# Patient Record
Sex: Male | Born: 1975 | Race: Black or African American | Hispanic: No | Marital: Single | Smoking: Current every day smoker
Health system: Southern US, Community
[De-identification: ages and names within clinical notes are randomized; demographics above are authoritative.]

## PROBLEM LIST (undated history)

## (undated) DIAGNOSIS — K279 Peptic ulcer, site unspecified, unspecified as acute or chronic, without hemorrhage or perforation: Secondary | ICD-10-CM

## (undated) DIAGNOSIS — F4310 Post-traumatic stress disorder, unspecified: Secondary | ICD-10-CM

## (undated) HISTORY — PX: ABDOMINAL SURGERY: SHX537

---

## 2000-08-17 ENCOUNTER — Emergency Department (HOSPITAL_COMMUNITY): Admission: EM | Admit: 2000-08-17 | Discharge: 2000-08-17 | Payer: Self-pay | Admitting: Emergency Medicine

## 2011-05-11 ENCOUNTER — Emergency Department (HOSPITAL_COMMUNITY)
Admission: EM | Admit: 2011-05-11 | Discharge: 2011-05-11 | Disposition: A | Discharge status: Home / Self Care | Payer: Self-pay | Attending: Emergency Medicine | Admitting: Emergency Medicine

## 2011-05-11 ENCOUNTER — Emergency Department (HOSPITAL_COMMUNITY): Payer: Self-pay

## 2011-05-11 ENCOUNTER — Encounter: Payer: Self-pay | Admitting: *Deleted

## 2011-05-11 ENCOUNTER — Other Ambulatory Visit: Payer: Self-pay

## 2011-05-11 DIAGNOSIS — R071 Chest pain on breathing: Secondary | ICD-10-CM | POA: Insufficient documentation

## 2011-05-11 DIAGNOSIS — R0789 Other chest pain: Secondary | ICD-10-CM

## 2011-05-11 LAB — DIFFERENTIAL
Basophils Absolute: 0 10*3/uL (ref 0.0–0.1)
Basophils Relative: 0 % (ref 0–1)
Eosinophils Absolute: 0.2 10*3/uL (ref 0.0–0.7)
Eosinophils Relative: 1 % (ref 0–5)
Lymphocytes Relative: 38 % (ref 12–46)
Lymphs Abs: 4.3 10*3/uL — ABNORMAL HIGH (ref 0.7–4.0)
Monocytes Absolute: 0.8 10*3/uL (ref 0.1–1.0)
Monocytes Relative: 7 % (ref 3–12)
Neutro Abs: 5.9 10*3/uL (ref 1.7–7.7)
Neutrophils Relative %: 53 % (ref 43–77)

## 2011-05-11 LAB — POCT I-STAT, CHEM 8
BUN: 13 mg/dL (ref 6–23)
Calcium, Ion: 1.16 mmol/L (ref 1.12–1.32)
Chloride: 104 mEq/L (ref 96–112)
Creatinine, Ser: 0.9 mg/dL (ref 0.50–1.35)
Glucose, Bld: 100 mg/dL — ABNORMAL HIGH (ref 70–99)
HCT: 41 % (ref 39.0–52.0)
Hemoglobin: 13.9 g/dL (ref 13.0–17.0)
Potassium: 3.8 mEq/L (ref 3.5–5.1)
Sodium: 141 mEq/L (ref 135–145)
TCO2: 25 mmol/L (ref 0–100)

## 2011-05-11 LAB — CBC
HCT: 38.8 % — ABNORMAL LOW (ref 39.0–52.0)
Hemoglobin: 13.4 g/dL (ref 13.0–17.0)
MCHC: 34.5 g/dL (ref 30.0–36.0)
MCV: 92.8 fL (ref 78.0–100.0)

## 2011-05-11 LAB — D-DIMER, QUANTITATIVE: D-Dimer, Quant: 0.33 ug/mL-FEU (ref 0.00–0.48)

## 2011-05-11 LAB — POCT I-STAT TROPONIN I
Troponin i, poc: 0.01 ng/mL (ref 0.00–0.08)
Troponin i, poc: 0 ng/mL (ref 0.00–0.08)

## 2011-05-11 MED ORDER — MORPHINE SULFATE 4 MG/ML IJ SOLN
4.0000 mg | Freq: Once | INTRAMUSCULAR | Status: AC
  Administered 2011-05-11: 4 mg via INTRAVENOUS
  Filled 2011-05-11: qty 1

## 2011-05-11 MED ORDER — ONDANSETRON HCL 4 MG/2ML IJ SOLN
4.0000 mg | Freq: Once | INTRAMUSCULAR | Status: AC
  Administered 2011-05-11: 4 mg via INTRAVENOUS
  Filled 2011-05-11: qty 2

## 2011-05-11 MED ORDER — HYDROCODONE-ACETAMINOPHEN 7.5-500 MG/15ML PO SOLN: 7.5000 mL | Freq: Three times a day (TID) | ORAL | Status: AC | PRN

## 2011-05-11 MED ORDER — ASPIRIN 81 MG PO CHEW
324.0000 mg | CHEWABLE_TABLET | Freq: Once | ORAL | Status: DC
  Filled 2011-05-11: qty 4

## 2011-05-11 NOTE — ED Notes (Signed)
Iv had been started prior to my shift, 20g right ac, dc prior to discharge, cath intact.

## 2011-05-11 NOTE — ED Notes (Signed)
Pt c/o pain in center of chest that began this evening.  States pain worsens with inspiration and palpation.  Denies recent injury.  States he feels SOB, O2 sat is 98% on RA.  Pain 6/10.  Pt was sleeping prior to assessment.

## 2011-05-11 NOTE — ED Notes (Signed)
C/o generalized CP, also neck, back and shoulder pain, worse with inspiration and movement, thinks it was r/t positioning earlier, "was resting with hand behind head and arm out to the side".

## 2011-05-11 NOTE — ED Notes (Signed)
Pt sleeping in triage, easily arousable, ambulatory to room, report given to TS, RN. EKG done on arrival, ordered per protocol.

## 2011-05-11 NOTE — ED Notes (Signed)
Pt reports that he is going to walk home

## 2011-05-11 NOTE — ED Provider Notes (Signed)
History     CSN: [id number] Arrival date & time: 05/11/2011  1:00 AM   First MD Initiated Contact with Patient 05/11/11 [phone]      Chief Complaint  Patient presents with  . Chest Pain    (Consider location/radiation/quality/duration/timing/severity/associated sxs/prior treatment) Patient is a 35 y.o. male presenting with chest pain. The history is provided by the patient.  Chest Pain The chest pain began 3 - 5 hours ago. Duration of episode(s) is 1 hour. Chest pain occurs constantly. The chest pain is unchanged. The pain is associated with breathing. The severity of the pain is severe. The quality of the pain is described as sharp. The pain does not radiate. Chest pain is worsened by certain positions and deep breathing. Pertinent negatives for primary symptoms include no fever, no syncope, no shortness of breath, no cough, no wheezing, no palpitations, no abdominal pain, no nausea, no vomiting, no dizziness and no altered mental status.  Pertinent negatives for associated symptoms include no near-syncope and no weakness. He tried nothing for the symptoms. Risk factors include alcohol intake, male gender and smoking/tobacco exposure.  Pertinent negatives for past medical history include no CAD, no MI and no PE.  Pertinent negatives for family medical history include: no CAD in family.    he states hurts substernal area, worse with movement, deep breath and with cough.   History reviewed. No pertinent past medical history.  Past Surgical History  Procedure Date  . Abdominal surgery     Family History  Problem Relation Age of Onset  . Heart failure Father     History  Substance Use Topics  . Smoking status: Current Everyday Smoker -- 1.0 packs/day  . Smokeless tobacco: Not on file  . Alcohol Use: No      Review of Systems  Constitutional: Negative for fever and chills.  HENT: Negative for neck pain and neck stiffness.   Eyes: Negative for pain.  Respiratory: Negative for  cough, shortness of breath and wheezing.   Cardiovascular: Positive for chest pain. Negative for palpitations, syncope and near-syncope.  Gastrointestinal: Negative for nausea, vomiting and abdominal pain.  Genitourinary: Negative for dysuria.  Musculoskeletal: Negative for back pain.  Skin: Negative for rash.  Neurological: Negative for dizziness, weakness and headaches.  Psychiatric/Behavioral: Negative for altered mental status.  All other systems reviewed and are negative.    Allergies  Nsaids  Home Medications  No current outpatient prescriptions on file.  BP 105/65  Pulse 69  Temp(Src) 98.2 F (36.8 C) (Oral)  Resp 15  SpO2 96%  Physical Exam  Constitutional: He is oriented to person, place, and time. He appears well-developed and well-nourished.  HENT:  Head: Normocephalic and atraumatic.  Eyes: Conjunctivae and EOM are normal. Pupils are equal, round, and reactive to light.  Neck: Trachea normal. Neck supple. No thyromegaly present.  Cardiovascular: Normal rate, regular rhythm, S1 normal, S2 normal and normal pulses.     No systolic murmur is present   No diastolic murmur is present  Pulses:      Radial pulses are 2+ on the right side, and 2+ on the left side.  Pulmonary/Chest: Effort normal and breath sounds normal. He has no wheezes. He has no rhonchi. He has no rales. He exhibits tenderness.       Reproducible anterior chest wall tenderness, no crepitus or rash. Guarded respirations secondary to pain.  Abdominal: Soft. Normal appearance and bowel sounds are normal. There is no tenderness. There is no CVA tenderness and  negative Murphy's sign.  Musculoskeletal:       BLE:s Calves nontender, no cords or erythema, negative Homans sign  Neurological: He is alert and oriented to person, place, and time. He has normal strength. No cranial nerve deficit or sensory deficit. GCS eye subscore is 4. GCS verbal subscore is 5. GCS motor subscore is 6.  Skin: Skin is warm and  dry. No rash noted. He is not diaphoretic.  Psychiatric: His speech is normal.       Cooperative and appropriate    ED Course  Procedures (including critical care time)  Results for orders placed during the hospital encounter of 05/11/11  CBC      Component Value Range   WBC 11.2 (*) 4.0 - 10.5 (K/uL)   RBC 4.18 (*) 4.22 - 5.81 (MIL/uL)   Hemoglobin 13.4  13.0 - 17.0 (g/dL)   HCT 16.1 (*) 09.6 - 52.0 (%)   MCV 92.8  78.0 - 100.0 (fL)   MCH 32.1  26.0 - 34.0 (pg)   MCHC 34.5  30.0 - 36.0 (g/dL)   RDW 04.5  40.9 - 81.1 (%)   Platelets 362  150 - 400 (K/uL)  DIFFERENTIAL      Component Value Range   Neutrophils Relative 53  43 - 77 (%)   Neutro Abs 5.9  1.7 - 7.7 (K/uL)   Lymphocytes Relative 38  12 - 46 (%)   Lymphs Abs 4.3 (*) 0.7 - 4.0 (K/uL)   Monocytes Relative 7  3 - 12 (%)   Monocytes Absolute 0.8  0.1 - 1.0 (K/uL)   Eosinophils Relative 1  0 - 5 (%)   Eosinophils Absolute 0.2  0.0 - 0.7 (K/uL)   Basophils Relative 0  0 - 1 (%)   Basophils Absolute 0.0  0.0 - 0.1 (K/uL)  POCT I-STAT, CHEM 8      Component Value Range   Sodium 141  135 - 145 (mEq/L)   Potassium 3.8  3.5 - 5.1 (mEq/L)   Chloride 104  96 - 112 (mEq/L)   BUN 13  6 - 23 (mg/dL)   Creatinine, Ser 9.14  0.50 - 1.35 (mg/dL)   Glucose, Bld 782 (*) 70 - 99 (mg/dL)   Calcium, Ion 9.56  2.13 - 1.32 (mmol/L)   TCO2 25  0 - 100 (mmol/L)   Hemoglobin 13.9  13.0 - 17.0 (g/dL)   HCT 08.6  57.8 - 46.9 (%)  POCT I-STAT TROPONIN I      Component Value Range   Troponin i, poc 0.01  0.00 - 0.08 (ng/mL)   Comment 3           D-DIMER, QUANTITATIVE      Component Value Range   D-Dimer, Quant 0.33  0.00 - 0.48 (ug/mL-FEU)  POCT I-STAT TROPONIN I      Component Value Range   Troponin i, poc 0.00  0.00 - 0.08 (ng/mL)   Comment 3            Dg Chest 2 View  05/11/2011  *RADIOLOGY REPORT*  Clinical Data: Chest pain; cough and pain with respiration. History of smoking.  CHEST - 2 VIEW  Comparison: None.  Findings: The  lungs are well-aerated and clear.  There is no evidence of focal opacification, pleural effusion or pneumothorax.  The heart is normal in size; slight apparent rightward shift of the mediastinum may be developmental in nature.  No acute osseous abnormalities are seen.  IMPRESSION: No acute cardiopulmonary process seen.  Original Report Authenticated By: Tonia Ghent, M.D.         Date: 05/11/2011  Rate: 77  Rhythm: normal sinus rhythm  QRS Axis: normal  Intervals: normal  ST/T Wave abnormalities: nonspecific ST changes  Conduction Disutrbances:none  Narrative Interpretation:   Old EKG Reviewed: none available     MDM   Pleuritic chest pain in a smoker with no history of similar symptoms.  EKG reviewed as above. Chest x-ray labs obtained and reviewed as above. Low pretest probability for PE with a negative d-dimer. On recheck pain resolved after one dose of morphine. Reproducible symptoms suggest chest wall pain. doubt ACS. Serial negative troponins        Sunnie Nielsen, MD 05/11/11 2255

## 2011-09-26 ENCOUNTER — Emergency Department (HOSPITAL_COMMUNITY)
Admission: EM | Admit: 2011-09-26 | Discharge: 2011-09-26 | Disposition: A | Discharge status: Home / Self Care | Payer: Self-pay | Attending: Emergency Medicine | Admitting: Emergency Medicine

## 2011-09-26 ENCOUNTER — Encounter (HOSPITAL_COMMUNITY): Payer: Self-pay | Admitting: Nurse Practitioner

## 2011-09-26 DIAGNOSIS — M25519 Pain in unspecified shoulder: Secondary | ICD-10-CM | POA: Insufficient documentation

## 2011-09-26 DIAGNOSIS — F17200 Nicotine dependence, unspecified, uncomplicated: Secondary | ICD-10-CM | POA: Insufficient documentation

## 2011-09-26 MED ORDER — NAPROXEN 375 MG PO TABS: 375.0000 mg | ORAL_TABLET | Freq: Two times a day (BID) | ORAL | Status: DC

## 2011-09-26 MED ORDER — KETOROLAC TROMETHAMINE 60 MG/2ML IM SOLN
60.0000 mg | Freq: Once | INTRAMUSCULAR | Status: AC
  Administered 2011-09-26: 60 mg via INTRAMUSCULAR
  Filled 2011-09-26: qty 2

## 2011-09-26 NOTE — ED Notes (Signed)
Reports old weight lifting injury to L shoulder. Over past 3 months pain has been increasingly worse. Cms intact

## 2011-09-26 NOTE — Discharge Instructions (Signed)
Be sure to read and understand instructions below prior to leaving the hospital. If your symptoms persist without any improvement in 1 week it is reccommended that you follow up with orthopedics listed above. Common mechanisms of injury include:   Direct hit (trauma) to the shoulder.  Aging, erosion of the tendon with normal use.  Bony bump on shoulder (acromial spur).  Stress from sudden increase in duration, frequency, or intensity of training  RISK INCREASES WITH:  Contact sports (football, wrestling, boxing).  Throwing sports (baseball, tennis, volleyball).  Weightlifting and bodybuilding.  Heavy labor.  Previous injury to the rotator cuff, including impingement.  Poor shoulder strength and flexibility.  Failure to warm up properly before activity.  Inadequate protective equipment.  Old age.  Bony bump on shoulder (acromial spur).   RELATED COMPLICATIONS  Shoulder stiffness, frozen shoulder, or loss of motion.  Rotator cuff tendon tear.  Recurring symptoms, especially if activity is resumed too soon, with overuse, with a direct blow, or when using poor technique.   TREATMENT  Treatment first involves the use of ice and medicine, to reduce pain and inflammation. The use of strengthening and stretching exercises may help reduce pain with activity. These exercises may be performed at home or with a therapist.  If non-surgical treatment is unsuccessful after more than 6 months, surgery may be advised. MEDICATION  If pain medicine is needed, nonsteroidal anti-inflammatory medicines (Motrin and ibuprofen), or other minor pain relievers (acetaminophen) Prescription pain relievers may be given, if your caregiver thinks they are needed. Use only as directed and only as much as you need  ACTIVITY       - It is reccommended to perform range of motion activity as tolerated to prevent shoulder stiffness.  HEAT AND COLD  Cold treatment (icing) should be applied for 10 to 15 minutes every 2 to  3 hours for inflammation and pain, and immediately after activity that aggravates your symptoms. Use ice packs or an ice massage.  Heat treatment may be used before performing stretching and strengthening activities prescribed by your caregiver, physical therapist, or athletic trainer. Use a heat pack or a warm water soak.  SEEK IMMEDIATE MEDICAL CARE IF:  Your arm, hand, or fingers are numb or tingling.  Your arm, hand, or fingers are swollen, painful, or turn white or blue.  You develop chest pain or shortness of breath.

## 2011-09-26 NOTE — ED Notes (Signed)
Pt reporting left shoulder pain x several months which has been getting worse. Pt able to move arm but pain worse with supination or hand. Radial pulse 2+, sensation intact. No swelling or redness noted.

## 2011-09-26 NOTE — ED Provider Notes (Signed)
History     CSN: [id number]  Arrival date & time 09/26/11  0910   First MD Initiated Contact with Patient 09/26/11 [phone]      Chief Complaint  Patient presents with  . Shoulder Pain    (Consider location/radiation/quality/duration/timing/severity/associated sxs/prior treatment) HPI Comments: Pt w a history of rotator cuff injury to left shoulder presents with cc of left shoulder pain. Symptoms began 3 months ago and it has been gradually worsening until this morning he cannot use his shoulder at all. Pt denies numbness and tingling of extremity but reports weakness and inability to abduct his arm. No alleviating factors have been tried. Pt has no other complaints at this time.   Patient is a 36 y.o. male presenting with shoulder pain. The history is provided by the patient.  Shoulder Pain This is a chronic problem. The current episode started today (3 mo ago started flaring up again, this morning pain unbearable, unable to use left shoulder at all ). The problem occurs constantly. The problem has been gradually worsening. Associated symptoms include arthralgias. Pertinent negatives include no abdominal pain, anorexia, change in bowel habit, chest pain, chills, congestion, coughing, diaphoresis, fatigue, fever, headaches, joint swelling, myalgias, nausea, neck pain, numbness, rash, sore throat, swollen glands, urinary symptoms, vertigo, visual change, vomiting or weakness.    History reviewed. No pertinent past medical history.  Past Surgical History  Procedure Date  . Abdominal surgery     Family History  Problem Relation Age of Onset  . Heart failure Father     History  Substance Use Topics  . Smoking status: Current Everyday Smoker -- 1.0 packs/day  . Smokeless tobacco: Not on file  . Alcohol Use: Yes     social      Review of Systems  Constitutional: Negative for fever, chills, diaphoresis and fatigue.  HENT: Negative for congestion, sore throat and neck pain.     Respiratory: Negative for cough.   Cardiovascular: Negative for chest pain.  Gastrointestinal: Negative for nausea, vomiting, abdominal pain, anorexia and change in bowel habit.  Musculoskeletal: Positive for arthralgias. Negative for myalgias and joint swelling.  Skin: Negative for rash.  Neurological: Negative for vertigo, weakness, numbness and headaches.  All other systems reviewed and are negative.    Allergies  Nsaids  Home Medications  No current outpatient prescriptions on file.  BP 118/74  Pulse 96  Temp(Src) 98.3 F (36.8 C) (Oral)  Resp 18  Ht 6' (1.829 m)  Wt 165 lb (74.844 kg)  BMI 22.38 kg/m2  SpO2 98%  Physical Exam  Constitutional: He is oriented to person, place, and time. He appears well-developed and well-nourished. No distress.  HENT:  Head: Normocephalic and atraumatic.  Mouth/Throat: Oropharynx is clear and moist. No oropharyngeal exudate.  Eyes: Conjunctivae and EOM are normal. Pupils are equal, round, and reactive to light. No scleral icterus.  Neck: Normal range of motion and full passive range of motion without pain. Neck supple. No spinous process tenderness and no muscular tenderness present. No tracheal deviation present. No thyromegaly present.  Cardiovascular: Normal rate, regular rhythm, normal heart sounds and intact distal pulses.   Pulmonary/Chest: Effort normal and breath sounds normal. No stridor. No respiratory distress. He has no wheezes.  Abdominal: Soft.  Musculoskeletal: He exhibits no edema and no tenderness.       Left shoulder: He exhibits decreased range of motion, tenderness, pain and decreased strength. He exhibits no bony tenderness, no swelling, no effusion and no crepitus.  Decreased strength and range of motion with abduction and external rotation of left shoulder.  Intact distal pulses & sensation.   Neurological: He is alert and oriented to person, place, and time. Coordination normal.  Skin: Skin is warm and dry. No  rash noted. He is not diaphoretic. No erythema. No pallor.  Psychiatric: He has a normal mood and affect. His behavior is normal.    ED Course  Procedures (including critical care time)  Labs Reviewed - No data to display No results found.   No diagnosis found.  The patient denies any neck pain. There is no tenderness on palpation of the cervical spine and no step-offs. The patient can look to the left and right voluntarily without pain and flex and extend the neck without pain. Cervical collar cleared.   MDM  Shoulder pain likely rotator tendinopathy vs impingement.    X-Ray not needed, atraumatic mechanism .No obvious dislocation. Pain managed in ED. Pt advised to follow up with orthopedics if symptoms persist. Conservative therapy recommended and discussed. Patient will be dc home & is agreeable with above plan.         Jaci Carrel, [place] 09/26/11 1015

## 2011-09-26 NOTE — ED Provider Notes (Signed)
Medical screening examination/treatment/procedure(s) were performed by non-physician practitioner and as supervising physician I was immediately available for consultation/collaboration.   Glynn Octave, MD 09/26/11 1538

## 2011-10-04 ENCOUNTER — Emergency Department (HOSPITAL_COMMUNITY)
Admission: EM | Admit: 2011-10-04 | Discharge: 2011-10-04 | Disposition: A | Discharge status: Home / Self Care | Payer: Self-pay | Attending: Emergency Medicine | Admitting: Emergency Medicine

## 2011-10-04 ENCOUNTER — Encounter (HOSPITAL_COMMUNITY): Payer: Self-pay | Admitting: Emergency Medicine

## 2011-10-04 DIAGNOSIS — T7840XA Allergy, unspecified, initial encounter: Secondary | ICD-10-CM

## 2011-10-04 DIAGNOSIS — R0602 Shortness of breath: Secondary | ICD-10-CM | POA: Insufficient documentation

## 2011-10-04 DIAGNOSIS — T394X5A Adverse effect of antirheumatics, not elsewhere classified, initial encounter: Secondary | ICD-10-CM | POA: Insufficient documentation

## 2011-10-04 DIAGNOSIS — L299 Pruritus, unspecified: Secondary | ICD-10-CM | POA: Insufficient documentation

## 2011-10-04 DIAGNOSIS — L509 Urticaria, unspecified: Secondary | ICD-10-CM | POA: Insufficient documentation

## 2011-10-04 DIAGNOSIS — F17200 Nicotine dependence, unspecified, uncomplicated: Secondary | ICD-10-CM | POA: Insufficient documentation

## 2011-10-04 MED ORDER — BENADRYL 25 MG PO TABS: 25.0000 mg | ORAL_TABLET | Freq: Four times a day (QID) | ORAL | Status: DC | PRN

## 2011-10-04 MED ORDER — FAMOTIDINE IN NACL 20-0.9 MG/50ML-% IV SOLN
20.0000 mg | Freq: Once | INTRAVENOUS | Status: AC
  Administered 2011-10-04: 20 mg via INTRAVENOUS
  Filled 2011-10-04: qty 50

## 2011-10-04 MED ORDER — DIPHENHYDRAMINE HCL 50 MG/ML IJ SOLN
50.0000 mg | Freq: Once | INTRAMUSCULAR | Status: AC
  Administered 2011-10-04: 50 mg via INTRAVENOUS
  Filled 2011-10-04: qty 1

## 2011-10-04 MED ORDER — PEPCID 20 MG PO TABS: 20.0000 mg | ORAL_TABLET | Freq: Two times a day (BID) | ORAL | Status: DC

## 2011-10-04 MED ORDER — METHYLPREDNISOLONE SODIUM SUCC 125 MG IJ SOLR
125.0000 mg | Freq: Once | INTRAMUSCULAR | Status: AC
  Administered 2011-10-04: 125 mg via INTRAVENOUS
  Filled 2011-10-04: qty 2

## 2011-10-04 MED ORDER — PREDNISONE 20 MG PO TABS: 40.0000 mg | ORAL_TABLET | Freq: Every day | ORAL | Status: AC

## 2011-10-04 MED ORDER — SODIUM CHLORIDE 0.9 % IV SOLN
INTRAVENOUS | Status: DC
  Administered 2011-10-04: 18:00:00 via INTRAVENOUS

## 2011-10-04 NOTE — ED Notes (Signed)
aaox4, resp e/u, nad. No facial swelling. Hands still swollen.

## 2011-10-04 NOTE — ED Provider Notes (Signed)
History     CSN: [id number]  Arrival date & time 10/04/11  1726   First MD Initiated Contact with Patient 10/04/11 1740      Chief Complaint  Patient presents with  . Allergic Reaction     HPI Pt was seen at 1750.  Per pt, c/o gradual onset and worsening of persistent allergic reaction that began approx 09/26/11 after he took naprosyn.  States he felt "itchy" several hours after taking the medication, but continued to take medicine.  Pt states he now has been having worsening of his "itching" as well as began to have "hives all over" since this morning.  Has been assoc with mild SOB.  Denies wheezing, no CP/palpitations, no abd pain, no N/V/D, no fevers.   History reviewed. No pertinent past medical history.  Past Surgical History  Procedure Date  . Abdominal surgery     Family History  Problem Relation Age of Onset  . Heart failure Father     History  Substance Use Topics  . Smoking status: Current Everyday Smoker -- 1.0 packs/day  . Smokeless tobacco: Not on file  . Alcohol Use: Yes     social    Review of Systems ROS: Statement: All systems negative except as marked or noted in the HPI; Constitutional: Negative for fever and chills. ; ; Eyes: Negative for eye pain, redness and discharge. ; ; ENMT: Negative for ear pain, hoarseness, nasal congestion, sinus pressure and sore throat. ; ; Cardiovascular: Negative for chest pain, palpitations, diaphoresis, dyspnea and peripheral edema. ; ; Respiratory: Negative for cough, wheezing and stridor. ; ; Gastrointestinal: Negative for nausea, vomiting, diarrhea, abdominal pain, blood in stool, hematemesis, jaundice and rectal bleeding. . ; ; Genitourinary: Negative for dysuria, flank pain and hematuria. ; ; Musculoskeletal: Negative for back pain and neck pain. Negative for swelling and trauma.; ; Skin: +diffuse hives. Negative for abrasions, blisters, bruising and skin lesion.; ; Neuro: Negative for headache, lightheadedness and neck  stiffness. Negative for weakness, altered level of consciousness , altered mental status, extremity weakness, paresthesias, involuntary movement, seizure and syncope.     Allergies  Nsaids  Home Medications   Current Outpatient Rx  Name Route Sig Dispense Refill  . NAPROXEN 375 MG PO TABS Oral Take 1 tablet (375 mg total) by mouth 2 (two) times daily. 20 tablet 0    BP 121/64  Pulse 80  Temp(Src) 98.7 F (37.1 C) (Oral)  Resp 18  SpO2 98%  Physical Exam 1755: Physical examination:  Nursing notes reviewed; Vital signs and O2 SAT reviewed;  Constitutional: Well developed, Well nourished, Well hydrated, In no acute distress; Head:  Normocephalic, atraumatic; Eyes: EOMI, PERRL, No scleral icterus; ENMT: Mouth and pharynx normal, no intra-oral edema, no hoarse voice, no drooling, no stridor. Mucous membranes moist; Neck: Supple, Full range of motion, No lymphadenopathy; Cardiovascular: Regular rate and rhythm, No murmur or gallop; Respiratory: Breath sounds clear & equal bilaterally, No wheezes, speaking full sentences with ease, Normal respiratory effort/excursion; Chest: Nontender, Movement normal; Abdomen: Soft, Nontender, Nondistended, Normal bowel sounds; Extremities: Pulses normal, No tenderness, No edema, No calf edema or asymmetry.; Neuro: AA&Ox3, Major CN grossly intact.  No gross focal motor or sensory deficits in extremities.; Skin: Color normal, Warm, Dry, +diffuse hives.    ED Course  Procedures  MDM  MDM Reviewed: nursing note and vitals     1825:  Will dose meds for allergic reaction and move to CDU to hold for the next 4 hours to  monitor for improvement.  Report to NP Crawford.        Laray Anger, DO 10/07/11 0201

## 2011-10-04 NOTE — ED Notes (Signed)
Patient states that he wants some meds for the pain in his left shoulder which was the main reason he came to be seen on 4/11.  Instructed that the prednisone should help with that pain.  Patient states that he does not like to take meds

## 2011-10-04 NOTE — ED Notes (Signed)
The pt has hives all over hisbody.  He has had since  07071m today getting worse  This afternoon.  He say it is startting to affect his breathing.  Facial swelling.  He has not taken any benadryl iv nasal 02.  monitor

## 2011-10-04 NOTE — ED Provider Notes (Signed)
1900 report received from Dr. Clarene Duke. Patient will be admitted to CDU for observation after allergic reaction from naproxen possibly.   2200  Discharged home with rx for prednisone, benadryl and pepcid.  Return if worse.  Remi Haggard, NP 10/05/11 [phone]

## 2011-10-04 NOTE — Discharge Instructions (Signed)
Juan Sawyer you received benadryl, steroids and pepcid in the ER tonight.  We will continue this combination using prednisone 40 mg daily, Benadryl 25 mg every 6 hours x24 hours, and Pepcid 20 mg once a day x5 days. Followup with PCP of your choice for get one from the list below. Return to the ER for difficulty breathing uncontrolled rash and itching.  Allergic Reaction Allergic reactions can be caused by anything your body is sensitive to. Your body may be sensitive to food, medicines, molds, pollens, cockroaches, dust mites, pets, insect stings, and other things around you. An allergic reaction may cause puffiness (swelling), itching, sneezing, coughing, or problems breathing.  Allergies cannot be cured, but they can be controlled with medicine. Some allergies happen only at certain times of the year. Try to stay away from what causes your reaction if possible. Sometimes, it is hard to tell what causes your reaction. HOME CARE If you have a rash or red patches (hives) on your skin:  Take medicines as told by your doctor.   Do not drive or drink alcohol after taking medicines. They can make you sleepy.   Put cold cloths on your skin. Take baths in cool water. This will help your itching. Do not take hot baths or showers. Heat will make the itching worse.   If your allergies get worse, your doctor might give you other medicines. Talk to your doctor if problems continue.  GET HELP RIGHT AWAY IF:   You have trouble breathing.   You have a tight feeling in your chest or throat.   Your mouth gets puffy (swollen).   You have red, itchy patches on your skin (hives) that get worse.   You have itching all over your body.  MAKE SURE YOU:   Understand these instructions.   Will watch your condition.   Will get help right away if you are not doing well or get worse.  Document Released: 05/22/2009 Document Revised: 05/23/2011 Document Reviewed: 05/22/2009 [facility] Patient Information 2012  [place], [place].

## 2011-10-04 NOTE — ED Notes (Signed)
The pt says that he feels better

## 2011-10-04 NOTE — ED Notes (Signed)
C/o itching all over and feeling like throat is closing up.  States he woke up with symptoms this morning.  Taking Naproxen since 4/11.

## 2011-10-04 NOTE — ED Notes (Signed)
Patient unhappy about waiting for a bus pass and left without receiving one.  Patient angry stating it was this hospital that caused him to come back.

## 2011-10-04 NOTE — ED Notes (Signed)
Both hands remain red and swollen but his hives are receding at present.  The pts face is less swollen and he is currently sleeping. 02 sats good .  Nasal 02 at 2

## 2011-10-04 NOTE — ED Notes (Signed)
The pt is sleeping still.  Facial swelling totally gone..  Both hands remain swollen

## 2011-10-04 NOTE — ED Notes (Signed)
The pt is sleeping still breathing with no difficulty

## 2011-10-05 MED ORDER — HEPARIN SODIUM (PORCINE) 5000 UNIT/ML IJ SOLN
INTRAMUSCULAR | Status: AC
  Filled 2011-10-05: qty 1

## 2011-10-07 NOTE — ED Provider Notes (Signed)
Medical screening examination/treatment/procedure(s) were conducted as a shared visit with non-physician practitioner(s) and myself.  I personally evaluated the patient during the encounter    Laray Anger, DO 10/07/11 0202

## 2011-12-09 ENCOUNTER — Encounter (HOSPITAL_COMMUNITY): Payer: Self-pay | Admitting: Emergency Medicine

## 2011-12-09 ENCOUNTER — Emergency Department (HOSPITAL_COMMUNITY)
Admission: EM | Admit: 2011-12-09 | Discharge: 2011-12-09 | Disposition: A | Discharge status: Home / Self Care | Payer: Self-pay | Attending: Emergency Medicine | Admitting: Emergency Medicine

## 2011-12-09 DIAGNOSIS — Z8249 Family history of ischemic heart disease and other diseases of the circulatory system: Secondary | ICD-10-CM | POA: Insufficient documentation

## 2011-12-09 DIAGNOSIS — M719 Bursopathy, unspecified: Secondary | ICD-10-CM | POA: Insufficient documentation

## 2011-12-09 DIAGNOSIS — M67919 Unspecified disorder of synovium and tendon, unspecified shoulder: Secondary | ICD-10-CM | POA: Insufficient documentation

## 2011-12-09 DIAGNOSIS — Z888 Allergy status to other drugs, medicaments and biological substances status: Secondary | ICD-10-CM | POA: Insufficient documentation

## 2011-12-09 DIAGNOSIS — F17200 Nicotine dependence, unspecified, uncomplicated: Secondary | ICD-10-CM | POA: Insufficient documentation

## 2011-12-09 DIAGNOSIS — M75100 Unspecified rotator cuff tear or rupture of unspecified shoulder, not specified as traumatic: Secondary | ICD-10-CM

## 2011-12-09 DIAGNOSIS — M25519 Pain in unspecified shoulder: Secondary | ICD-10-CM | POA: Insufficient documentation

## 2011-12-09 MED ORDER — HYDROCODONE-ACETAMINOPHEN 5-325 MG PO TABS
2.0000 | ORAL_TABLET | Freq: Once | ORAL | Status: AC
  Administered 2011-12-09: 2 via ORAL
  Filled 2011-12-09: qty 2

## 2011-12-09 MED ORDER — HYDROCODONE-ACETAMINOPHEN 5-325 MG PO TABS: 1.0000 | ORAL_TABLET | ORAL | Status: AC | PRN

## 2011-12-09 NOTE — Discharge Instructions (Signed)
You were seen and evaluated for your shoulder pains. Please use rest, ice, compression and elevation to help reduce pain and swelling. Please followup with orthopedic specialist for continued evaluation and treatment of your symptoms.   Shoulder Pain The shoulder is a ball and socket joint. The muscles and tendons (rotator cuff) are what keep the shoulder in its joint and stable. This collection of muscles and tendons holds in the head (ball) of the humerus (upper arm bone) in the fossa (cup) of the scapula (shoulder blade). Today no reason was found for your shoulder pain. Often pain in the shoulder may be treated conservatively with temporary immobilization. For example, holding the shoulder in one place using a sling for rest. Physical therapy may be needed if problems continue. HOME CARE INSTRUCTIONS   Apply ice to the sore area for 15 to 20 minutes, 3 to 4 times per day for the first 2 days. Put the ice in a plastic bag. Place a towel between the bag of ice and your skin.   If you have or were given a shoulder sling and straps, do not remove for as long as directed by your caregiver or until you see a caregiver for a follow-up examination. If you need to remove it to shower or bathe, move your arm as little as possible.   Sleep on several pillows at night to lessen swelling and pain.   Only take over-the-counter or prescription medicines for pain, discomfort, or fever as directed by your caregiver.   Keep any follow-up appointments in order to avoid any type of permanent shoulder disability or chronic pain problems.  SEEK MEDICAL CARE IF:   Pain in your shoulder increases or new pain develops in your arm, hand, or fingers.   Your hand or fingers are colder than your other hand.   You do not obtain pain relief with the medications or your pain becomes worse.  SEEK IMMEDIATE MEDICAL CARE IF:   Your arm, hand, or fingers are numb or tingling.   Your arm, hand, or fingers are swollen,  painful, or turn white or blue.   You develop chest pain or shortness of breath.  MAKE SURE YOU:   Understand these instructions.   Will watch your condition.   Will get help right away if you are not doing well or get worse.  Document Released: 03/13/2005 Document Revised: 05/23/2011 Document Reviewed: 05/18/2011 [facility] Patient Information 2012 [place], [place].     Shoulder Range of Motion Exercises The shoulder is the most flexible joint in the human body. Because of this it is also the most unstable joint in the body. All ages can develop shoulder problems. Early treatment of problems is necessary for a good outcome. People react to shoulder pain by decreasing the movement of the joint. After a brief period of time, the shoulder can become "frozen". This is an almost complete loss of the ability to move the damaged shoulder. Following injuries your caregivers can give you instructions on exercises to keep your range of motion (ability to move your shoulder freely), or regain it if it has been lost.  EXERCISES EXERCISES TO MAINTAIN THE MOBILITY OF YOUR SHOULDER: Codman's Exercise or Pendulum Exercise  This exercise may be performed in a prone (face-down) lying position or standing while leaning on a chair with the opposite arm. Its purpose is to relax the muscles in your shoulder and slowly but surely increase the range of motion and to relieve pain.   Lie on your  stomach close to the side edge of the bed. Let your weak arm hang over the edge of the bed. Relax your shoulder, arm and hand. Let your shoulder blade relax and drop down.   Slowly and gently swing your arm forward and back. Do not use your neck muscles; relax them. It might be easier to have someone else gently start swinging your arm.   As pain decreases, increase your swing. To start, arm swing should begin at 15 degree angles. In time and as pain lessens, move to 30-45 degree angles. Start with swinging for about 15  seconds, and work towards swinging for 3 to 5 minutes.   This exercise may also be performed in a standing/bent over position.   Stand and hold onto a sturdy chair with your good arm. Bend forward at the waist and bend your knees slightly to help protect your back. Relax your weak arm, let it hang limp. Relax your shoulder blade and let it drop.   Keep your shoulder relaxed and use body motion to swing your arm in small circles.   Stand up tall and relax.   Repeat motion and change direction of circles.   Start with swinging for about 30 seconds, and work towards swinging for 3 to 5 minutes.  STRETCHING EXERCISES:  Lift your arm out in front of you with the elbow bent at 90 degrees. Using your other arm gently pull the elbow forward and across your body.   Bend one arm behind you with the palm facing outward. Using the other arm, hold a towel or rope and reach this arm up above your head, then bend it at the elbow to move your wrist to behind your neck. Grab the free end of the towel with the hand behind your back. Gently pull the towel up with the hand behind your neck, gradually increasing the pull on the hand behind the small of your back. Then, gradually pull down with the hand behind the small of your back. This will pull the hand and arm behind your neck further. Both shoulders will have an increased range of motion with repetition of this exercise.  STRENGTHENING EXERCISES:  Standing with your arm at your side and straight out from your shoulder with the elbow bent at 90 degrees, hold onto a small weight and slowly raise your hand so it points straight up in the air. Repeat this five times to begin with, and gradually increase to ten times. Do this four times per day. As you grow stronger you can gradually increase the weight.   Repeat the above exercise, only this time using an elastic band. Start with your hand up in the air and pull down until your hand is by your side. As you grow  stronger, gradually increase the amount you pull by increasing the number or size of the elastic bands. Use the same amount of repetitions.   Standing with your hand at your side and holding onto a weight, gradually lift the hand in front of you until it is over your head. Do the same also with the hand remaining at your side and lift the hand away from your body until it is again over your head. Repeat this five times to begin with, and gradually increase to ten times. Do this four times per day. As you grow stronger you can gradually increase the weight.  Document Released: 03/02/2003 Document Revised: 05/23/2011 Document Reviewed: 06/03/2005 [facility] Patient Information 2012 [place], [place].
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
## 2011-12-09 NOTE — ED Notes (Signed)
Pt to ED c/o hurting L shoulder d/t chronic rotator cuff pain.  Increased pain when raising arm.

## 2011-12-09 NOTE — ED Provider Notes (Signed)
History     CSN: [id number]  Arrival date & time 12/09/11  4098   First MD Initiated Contact with Patient 12/09/11 2157      Chief Complaint  Patient presents with  . Shoulder Pain    HPI  History provided by the patient. Patient is a 36 year old male with no significant past medical history who presents with complaints of left shoulder pain. Patient reports having old injury to the shoulder in the past but states he normally has no pain or palpitations with the shoulder. About 3 or 4 days ago patient states he began to have some soreness and aching after lifting a heavy bag of groceries. Since that time he has had worsening pain with decreased range of motion left shoulder. He is trying to use over-the-counter pain medications without significant relief. He denies any other aggravating or alleviating factors. He denies any associated numbness or weakness in the hand. He denies any neck pain or prior neck injury. Symptoms are described as severe.    History reviewed. No pertinent past medical history.  Past Surgical History  Procedure Date  . Abdominal surgery     Family History  Problem Relation Age of Onset  . Heart failure Father     History  Substance Use Topics  . Smoking status: Current Everyday Smoker -- 1.0 packs/day  . Smokeless tobacco: Not on file  . Alcohol Use: Yes     social      Review of Systems  HENT: Negative for neck pain.   Musculoskeletal: Negative for joint swelling.  Skin: Negative for rash.  Neurological: Negative for weakness and numbness.    Allergies  Naprosyn and Nsaids  Home Medications  No current outpatient prescriptions on file.  BP 115/63  Pulse 81  Temp 98.8 F (37.1 C) (Oral)  Resp 18  SpO2 96%  Physical Exam  Nursing note and vitals reviewed. Constitutional: He appears well-developed and well-nourished. No distress.  HENT:  Head: Normocephalic.  Neck: Normal range of motion. Neck supple.       No cervical midline  tenderness.  Cardiovascular: Normal rate and regular rhythm.   Pulmonary/Chest: Effort normal and breath sounds normal.  Musculoskeletal:       Pain with range of motion of left shoulder is slightly reduced range of motion. Pain is greatest with abduction. There is tenderness over the anterior portion of the shoulder and deltoid. Normal a.c. joint without deformity. Normal clavicle and scapula. Normal distal radial pulse, sensation in hands and fingers with normal cap refill less than 2 seconds.  Neurological: He is alert.  Skin: Skin is warm.  Psychiatric: He has a normal mood and affect. His behavior is normal.    ED Course  Procedures     1. Shoulder pain   2. Rotator cuff syndrome       MDM  10:50PM patient seen and evaluated. Patient no acute distress.        Angus Seller, [place] 12/11/11 [phone]

## 2011-12-12 NOTE — ED Provider Notes (Signed)
Medical screening examination/treatment/procedure(s) were performed by non-physician practitioner and as supervising physician I was immediately available for consultation/collaboration.   Nat Christen, MD 12/12/11 [phone]

## 2012-02-09 ENCOUNTER — Emergency Department (HOSPITAL_COMMUNITY)
Admission: EM | Admit: 2012-02-09 | Discharge: 2012-02-09 | Disposition: A | Discharge status: Home / Self Care | Payer: No Typology Code available for payment source | Attending: Emergency Medicine | Admitting: Emergency Medicine

## 2012-02-09 ENCOUNTER — Encounter (HOSPITAL_COMMUNITY): Payer: Self-pay | Admitting: Emergency Medicine

## 2012-02-09 DIAGNOSIS — M545 Low back pain: Secondary | ICD-10-CM

## 2012-02-09 DIAGNOSIS — F17200 Nicotine dependence, unspecified, uncomplicated: Secondary | ICD-10-CM | POA: Insufficient documentation

## 2012-02-09 DIAGNOSIS — Y9389 Activity, other specified: Secondary | ICD-10-CM | POA: Insufficient documentation

## 2012-02-09 DIAGNOSIS — S46909A Unspecified injury of unspecified muscle, fascia and tendon at shoulder and upper arm level, unspecified arm, initial encounter: Secondary | ICD-10-CM | POA: Insufficient documentation

## 2012-02-09 DIAGNOSIS — M25519 Pain in unspecified shoulder: Secondary | ICD-10-CM

## 2012-02-09 DIAGNOSIS — S4980XA Other specified injuries of shoulder and upper arm, unspecified arm, initial encounter: Secondary | ICD-10-CM | POA: Insufficient documentation

## 2012-02-09 DIAGNOSIS — Y998 Other external cause status: Secondary | ICD-10-CM | POA: Insufficient documentation

## 2012-02-09 MED ORDER — HYDROCODONE-ACETAMINOPHEN 5-500 MG PO TABS: 1.0000 | ORAL_TABLET | Freq: Four times a day (QID) | ORAL | Status: AC | PRN
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
## 2012-04-12 ENCOUNTER — Encounter (HOSPITAL_COMMUNITY): Payer: Self-pay

## 2012-04-12 ENCOUNTER — Emergency Department (HOSPITAL_COMMUNITY)
Admission: EM | Admit: 2012-04-12 | Discharge: 2012-04-12 | Disposition: A | Discharge status: Home / Self Care | Payer: Self-pay | Attending: Emergency Medicine | Admitting: Emergency Medicine

## 2012-04-12 DIAGNOSIS — Z8711 Personal history of peptic ulcer disease: Secondary | ICD-10-CM | POA: Insufficient documentation

## 2012-04-12 DIAGNOSIS — K625 Hemorrhage of anus and rectum: Secondary | ICD-10-CM | POA: Insufficient documentation

## 2012-04-12 DIAGNOSIS — F1210 Cannabis abuse, uncomplicated: Secondary | ICD-10-CM | POA: Insufficient documentation

## 2012-04-12 DIAGNOSIS — F17200 Nicotine dependence, unspecified, uncomplicated: Secondary | ICD-10-CM | POA: Insufficient documentation

## 2012-04-12 HISTORY — DX: Peptic ulcer, site unspecified, unspecified as acute or chronic, without hemorrhage or perforation: K27.9

## 2012-04-12 LAB — COMPREHENSIVE METABOLIC PANEL
ALT: 14 U/L (ref 0–53)
BUN: 11 mg/dL (ref 6–23)
CO2: 24 mEq/L (ref 19–32)
Calcium: 9 mg/dL (ref 8.4–10.5)
Creatinine, Ser: 1.01 mg/dL (ref 0.50–1.35)
GFR calc Af Amer: >90 mL/min (ref >90)
GFR calc non Af Amer: >90 mL/min (ref >90)
Glucose, Bld: 93 mg/dL (ref 70–99)

## 2012-04-12 LAB — CBC
HCT: 42.3 % (ref 39.0–52.0)
Hemoglobin: 14.9 g/dL (ref 13.0–17.0)
RBC: 4.62 MIL/uL (ref 4.22–5.81)

## 2012-04-12 LAB — OCCULT BLOOD, POC DEVICE: Fecal Occult Bld: NEGATIVE

## 2012-04-12 MED ORDER — FAMOTIDINE 20 MG PO TABS: 20.0000 mg | ORAL_TABLET | Freq: Two times a day (BID) | ORAL | Status: DC

## 2012-04-12 MED ORDER — SODIUM CHLORIDE 0.9 % IV BOLUS (SEPSIS)
1000.0000 mL | Freq: Once | INTRAVENOUS | Status: AC
  Administered 2012-04-12: 1000 mL via INTRAVENOUS

## 2012-04-12 NOTE — ED Notes (Signed)
Lab at bedside

## 2012-04-12 NOTE — ED Provider Notes (Signed)
History     CSN: [id number]  Arrival date & time 04/12/12  9147   First MD Initiated Contact with Patient 04/12/12 [phone]      Chief Complaint  Patient presents with  . Rectal Bleeding    (Consider location/radiation/quality/duration/timing/severity/associated sxs/prior treatment) Patient is a 36 y.o. male presenting with abdominal pain. The history is provided by the patient. No language interpreter was used.  Abdominal Pain The primary symptoms of the illness include abdominal pain, shortness of breath and nausea. The primary symptoms of the illness do not include fever or vomiting.  The illness is associated with retching. Symptoms associated with the illness do not include chills, constipation, urgency, hematuria, frequency or back pain. Significant associated medical issues include PUD. Significant associated medical issues do not include GERD, diabetes, liver disease, diverticulitis, HIV or cardiac disease.   36 year old male coming in with complaint of blood in his stool this morning. States that he had a large bowel movement and the water turned peak. States that after that he did vomit some tea in substance after dry heaving as well. Complaining of mild epigastric pain. Patient is on no PPI. Denies hemorrhoids, constipation, dizziness, palpitations. Patient also having some epigastric pain. States that he has a past medical history of perforated ulcer 2 years ago he had abdominal surgery in [place]. States that he rarely drinks alcohol but he does use marijuana. Last recorded hemoglobin was 11.2. No other past medical history.    Past Medical History  Diagnosis Date  . Peptic ulcer disease     Past Surgical History  Procedure Date  . Abdominal surgery     Family History  Problem Relation Age of Onset  . Heart failure Father     History  Substance Use Topics  . Smoking status: Current Every Day Smoker -- 2.0 packs/day  . Smokeless tobacco: Current User  . Alcohol  Use: Yes     social      Review of Systems  Constitutional: Negative.  Negative for fever and chills.  HENT: Negative.   Eyes: Negative.   Respiratory: Positive for shortness of breath.   Cardiovascular: Negative.   Gastrointestinal: Positive for nausea, abdominal pain and blood in stool. Negative for vomiting, constipation and rectal pain.  Genitourinary: Negative for urgency, frequency and hematuria.  Musculoskeletal: Negative for back pain.  Neurological: Negative.  Negative for dizziness, syncope, weakness and light-headedness.  Psychiatric/Behavioral: Negative.   All other systems reviewed and are negative.    Allergies  Naprosyn and Nsaids  Home Medications   Current Outpatient Rx  Name Route Sig Dispense Refill  . DIPHENHYDRAMINE HCL 25 MG PO CAPS Oral Take 25 mg by mouth at bedtime as needed. For sleep      BP 133/80  Pulse 83  Temp 98.7 F (37.1 C) (Oral)  Resp 20  SpO2 98%  Physical Exam  Nursing note and vitals reviewed. Constitutional: He is oriented to person, place, and time. He appears well-developed and well-nourished.  HENT:  Head: Normocephalic.  Eyes: Conjunctivae normal and EOM are normal. Pupils are equal, round, and reactive to light.  Neck: Normal range of motion. Neck supple.  Cardiovascular: Normal rate.   Pulmonary/Chest: Effort normal.  Abdominal: Soft. He exhibits no distension and no mass. There is tenderness. There is no rebound and no guarding.  Musculoskeletal: Normal range of motion.  Neurological: He is alert and oriented to person, place, and time.  Skin: Skin is warm and dry.  Psychiatric: He has a normal  mood and affect.    ED Course  Procedures (including critical care time)   Labs Reviewed  CBC  COMPREHENSIVE METABOLIC PANEL   No results found.   No diagnosis found.    MDM  Blood tinged bm this am with pmh of pud.  hgb 14. Guiac -. VSS no dizziness.  All labs unremarkable.  Will get pcp from list to follow  up with.  Start pepcid.  Return to ER for dizziness, Homero Fellers blood or any concerns.         Remi Haggard, NP 04/12/12 1932

## 2012-04-12 NOTE — ED Notes (Signed)
Pt noticed blood in stool when flushing the commode this am.

## 2012-04-14 NOTE — ED Provider Notes (Signed)
Medical screening examination/treatment/procedure(s) were performed by non-physician practitioner and as supervising physician I was immediately available for consultation/collaboration.  Djon Tith, MD 04/14/12 2159 

## 2012-06-29 ENCOUNTER — Emergency Department (HOSPITAL_COMMUNITY)
Admission: EM | Admit: 2012-06-29 | Discharge: 2012-06-29 | Disposition: A | Discharge status: Home / Self Care | Payer: Self-pay | Attending: Emergency Medicine | Admitting: Emergency Medicine

## 2012-06-29 ENCOUNTER — Encounter (HOSPITAL_COMMUNITY): Payer: Self-pay | Admitting: Emergency Medicine

## 2012-06-29 DIAGNOSIS — T394X5A Adverse effect of antirheumatics, not elsewhere classified, initial encounter: Secondary | ICD-10-CM | POA: Insufficient documentation

## 2012-06-29 DIAGNOSIS — R221 Localized swelling, mass and lump, neck: Secondary | ICD-10-CM | POA: Insufficient documentation

## 2012-06-29 DIAGNOSIS — Z8711 Personal history of peptic ulcer disease: Secondary | ICD-10-CM | POA: Insufficient documentation

## 2012-06-29 DIAGNOSIS — R0609 Other forms of dyspnea: Secondary | ICD-10-CM | POA: Insufficient documentation

## 2012-06-29 DIAGNOSIS — T7840XA Allergy, unspecified, initial encounter: Secondary | ICD-10-CM

## 2012-06-29 DIAGNOSIS — R220 Localized swelling, mass and lump, head: Secondary | ICD-10-CM | POA: Insufficient documentation

## 2012-06-29 DIAGNOSIS — L2989 Other pruritus: Secondary | ICD-10-CM | POA: Insufficient documentation

## 2012-06-29 DIAGNOSIS — K047 Periapical abscess without sinus: Secondary | ICD-10-CM

## 2012-06-29 DIAGNOSIS — K089 Disorder of teeth and supporting structures, unspecified: Secondary | ICD-10-CM | POA: Insufficient documentation

## 2012-06-29 DIAGNOSIS — Z79899 Other long term (current) drug therapy: Secondary | ICD-10-CM | POA: Insufficient documentation

## 2012-06-29 DIAGNOSIS — K044 Acute apical periodontitis of pulpal origin: Secondary | ICD-10-CM | POA: Insufficient documentation

## 2012-06-29 DIAGNOSIS — L298 Other pruritus: Secondary | ICD-10-CM | POA: Insufficient documentation

## 2012-06-29 DIAGNOSIS — K0889 Other specified disorders of teeth and supporting structures: Secondary | ICD-10-CM

## 2012-06-29 DIAGNOSIS — F17200 Nicotine dependence, unspecified, uncomplicated: Secondary | ICD-10-CM | POA: Insufficient documentation

## 2012-06-29 DIAGNOSIS — R0989 Other specified symptoms and signs involving the circulatory and respiratory systems: Secondary | ICD-10-CM | POA: Insufficient documentation

## 2012-06-29 DIAGNOSIS — R61 Generalized hyperhidrosis: Secondary | ICD-10-CM | POA: Insufficient documentation

## 2012-06-29 MED ORDER — OXYCODONE-ACETAMINOPHEN 5-325 MG PO TABS
2.0000 | ORAL_TABLET | Freq: Once | ORAL | Status: AC
  Administered 2012-06-29: 2 via ORAL
  Filled 2012-06-29: qty 2

## 2012-06-29 MED ORDER — ACETAMINOPHEN-CODEINE #3 300-30 MG PO TABS: 1.0000 | ORAL_TABLET | Freq: Four times a day (QID) | ORAL | Status: DC | PRN

## 2012-06-29 MED ORDER — DIPHENHYDRAMINE HCL 25 MG PO TABS: 25.0000 mg | ORAL_TABLET | Freq: Four times a day (QID) | ORAL | Status: DC

## 2012-06-29 MED ORDER — AMOXICILLIN 500 MG PO CAPS: 500.0000 mg | ORAL_CAPSULE | Freq: Three times a day (TID) | ORAL | Status: DC

## 2012-06-29 NOTE — ED Notes (Signed)
Pt took naproxen at 0830 for dental pain. Pt reports EMS was called, pt was sweating, vomiting and felt weak. Pt currently resting. Pt oriented, appropriate but sleepy. Friend at bedside

## 2012-06-29 NOTE — ED Notes (Signed)
LKG:MWN0<UV> Expected date:<BR> Expected time:<BR> Means of arrival:<BR> Comments:<BR> Dental pain/allergic to his NSAID he took for pain

## 2012-06-29 NOTE — ED Provider Notes (Signed)
History     CSN: [id number]  Arrival date & time 06/29/12  2130   First MD Initiated Contact with Patient 06/29/12 1029      Chief Complaint  Patient presents with  . Allergic Reaction    took naproxin at 0830. Pt stated sweating, itching  . Dental Problem    denies pain at present    (Consider location/radiation/quality/duration/timing/severity/associated sxs/prior treatment) HPI Comments: 37 year old male presents emergency department with his girlfriend after taking 2 naproxen for dental pain at home and being unaware that this is an NSAID which he is allergic to. In the past he took ibuprofen which causes face as well. Today is his lips again to swell a little bit and it became difficult to breathe. His girlfriend called EMS and was given Benadryl which provided complete relief of this reaction. States the Aleve did help his dental pain, however anytime he bites of something the pain is worse. Admits he needs to see a dentist. Denies fever, chills, nausea or vomiting. Currently denies difficulty breathing or swallowing. No rash.  Patient is a 37 y.o. male presenting with allergic reaction. The history is provided by the patient and a significant other.  Allergic Reaction The primary symptoms do not include shortness of breath, nausea, vomiting or rash.    Past Medical History  Diagnosis Date  . Peptic ulcer disease     Past Surgical History  Procedure Date  . Abdominal surgery     Family History  Problem Relation Age of Onset  . Heart failure Father   . Diabetes Father   . Hypertension Father   . Cancer Father   . Hypertension Mother   . Diabetes Mother   . Cancer Mother     History  Substance Use Topics  . Smoking status: Current Every Day Smoker -- 2.0 packs/day    Types: Cigarettes  . Smokeless tobacco: Current User  . Alcohol Use: Yes     Comment: social      Review of Systems  Constitutional: Negative for fever and chills.  HENT: Positive for facial  swelling and dental problem. Negative for trouble swallowing.   Respiratory: Negative for chest tightness and shortness of breath.   Cardiovascular: Negative for chest pain.  Gastrointestinal: Negative for nausea and vomiting.  Skin: Negative for rash.  All other systems reviewed and are negative.    Allergies  Naprosyn and Nsaids  Home Medications   Current Outpatient Rx  Name  Route  Sig  Dispense  Refill  . NAPROXEN SODIUM 220 MG PO TABS   Oral   Take 220 mg by mouth 2 (two) times daily with a meal. pain         . ACETAMINOPHEN-CODEINE #3 300-30 MG PO TABS   Oral   Take 1-2 tablets by mouth every 6 (six) hours as needed for pain.   10 tablet   0   . DIPHENHYDRAMINE HCL 25 MG PO TABS   Oral   Take 1 tablet (25 mg total) by mouth every 6 (six) hours.   20 tablet   0     BP 119/83  Pulse 69  Temp 97.5 F (36.4 C) (Oral)  Resp 18  Ht 6' (1.829 m)  Wt 165 lb (74.844 kg)  BMI 22.38 kg/m2  SpO2 100%  Physical Exam  Nursing note and vitals reviewed. Constitutional: He is oriented to person, place, and time. He appears well-developed and well-nourished. No distress.  HENT:  Head: Normocephalic and atraumatic.  Mouth/Throat:  Uvula is midline, oropharynx is clear and moist and mucous membranes are normal. Abnormal dentition. Dental caries present. No dental abscesses or uvula swelling. No posterior oropharyngeal edema.    Eyes: Conjunctivae normal and EOM are normal. Pupils are equal, round, and reactive to light.  Neck: Normal range of motion. Neck supple. No tracheal deviation present.  Cardiovascular: Normal rate, regular rhythm, normal heart sounds and intact distal pulses.   Pulmonary/Chest: Effort normal and breath sounds normal. No respiratory distress. He has no wheezes.  Abdominal: Soft. Bowel sounds are normal. There is no tenderness.  Musculoskeletal: Normal range of motion. He exhibits no edema.  Neurological: He is alert and oriented to person, place,  and time.  Skin: Skin is warm and dry. No rash noted.  Psychiatric: He has a normal mood and affect. His behavior is normal.    ED Course  Procedures (including critical care time)  Labs Reviewed - No data to display No results found.   1. Allergic reaction to drug   2. Dental infection   3. Pain, dental       MDM  37 year old male with allergic reaction to naproxen dental pain. Currently he has no facial swelling, difficulty breathing or swallowing. He is in no respiratory distress.  Dental pain associated with dental infection. No evidence of dental abscess. Patient is afebrile, non toxic appearing and swallowing secretions well. I gave patient referral to dentist and stressed the importance of dental follow up for ultimate management of dental pain. I will also give amoxicillin and pain control. Patient voices understanding and is agreeable to plan.         Trevor Mace, PA-C 06/29/12 1114

## 2012-07-02 NOTE — ED Provider Notes (Signed)
Medical screening examination/treatment/procedure(s) were performed by non-physician practitioner and as supervising physician I was immediately available for consultation/collaboration.  Laynee Lockamy T Blakeleigh Domek, MD 07/02/12 0750 

## 2013-07-02 ENCOUNTER — Encounter (HOSPITAL_COMMUNITY): Payer: Self-pay | Admitting: Emergency Medicine

## 2013-07-02 ENCOUNTER — Emergency Department (HOSPITAL_COMMUNITY)
Admission: EM | Admit: 2013-07-02 | Discharge: 2013-07-02 | Disposition: A | Discharge status: Home / Self Care | Payer: Self-pay | Attending: Emergency Medicine | Admitting: Emergency Medicine

## 2013-07-02 DIAGNOSIS — F411 Generalized anxiety disorder: Secondary | ICD-10-CM | POA: Insufficient documentation

## 2013-07-02 DIAGNOSIS — F4310 Post-traumatic stress disorder, unspecified: Secondary | ICD-10-CM | POA: Insufficient documentation

## 2013-07-02 DIAGNOSIS — F17200 Nicotine dependence, unspecified, uncomplicated: Secondary | ICD-10-CM | POA: Insufficient documentation

## 2013-07-02 DIAGNOSIS — F3289 Other specified depressive episodes: Secondary | ICD-10-CM | POA: Insufficient documentation

## 2013-07-02 DIAGNOSIS — R45851 Suicidal ideations: Secondary | ICD-10-CM | POA: Insufficient documentation

## 2013-07-02 DIAGNOSIS — Z8711 Personal history of peptic ulcer disease: Secondary | ICD-10-CM | POA: Insufficient documentation

## 2013-07-02 DIAGNOSIS — F329 Major depressive disorder, single episode, unspecified: Secondary | ICD-10-CM | POA: Insufficient documentation

## 2013-07-02 DIAGNOSIS — F32A Depression, unspecified: Secondary | ICD-10-CM

## 2013-07-02 HISTORY — DX: Post-traumatic stress disorder, unspecified: F43.10

## 2013-07-02 LAB — CBC WITH DIFFERENTIAL/PLATELET
BASOS ABS: 0 10*3/uL (ref 0.0–0.1)
BASOS PCT: 0 % (ref 0–1)
EOS PCT: 0 % (ref 0–5)
Eosinophils Absolute: 0 10*3/uL (ref 0.0–0.7)
HEMATOCRIT: 38.8 % — AB (ref 39.0–52.0)
HEMOGLOBIN: 13.3 g/dL (ref 13.0–17.0)
LYMPHS PCT: 26 % (ref 12–46)
Lymphs Abs: 2.6 10*3/uL (ref 0.7–4.0)
MCH: 31.6 pg (ref 26.0–34.0)
MCHC: 34.3 g/dL (ref 30.0–36.0)
MCV: 92.2 fL (ref 78.0–100.0)
MONO ABS: 0.8 10*3/uL (ref 0.1–1.0)
MONOS PCT: 7 % (ref 3–12)
NEUTROS ABS: 6.9 10*3/uL (ref 1.7–7.7)
Neutrophils Relative %: 66 % (ref 43–77)
Platelets: 374 10*3/uL (ref 150–400)
RBC: 4.21 MIL/uL — ABNORMAL LOW (ref 4.22–5.81)
RDW: 13.7 % (ref 11.5–15.5)
WBC: 10.3 10*3/uL (ref 4.0–10.5)

## 2013-07-02 LAB — BASIC METABOLIC PANEL
BUN: 17 mg/dL (ref 6–23)
CALCIUM: 9 mg/dL (ref 8.4–10.5)
CHLORIDE: 99 meq/L (ref 96–112)
CO2: 24 meq/L (ref 19–32)
CREATININE: 0.88 mg/dL (ref 0.50–1.35)
GFR calc Af Amer: >90 mL/min (ref >90)
GFR calc non Af Amer: >90 mL/min (ref >90)
Glucose, Bld: 95 mg/dL (ref 70–99)
Potassium: 4 mEq/L (ref 3.7–5.3)
Sodium: 138 mEq/L (ref 137–147)

## 2013-07-02 LAB — URINALYSIS, ROUTINE W REFLEX MICROSCOPIC
Glucose, UA: NEGATIVE mg/dL
Hgb urine dipstick: NEGATIVE
Ketones, ur: 15 mg/dL — AB
LEUKOCYTES UA: NEGATIVE
NITRITE: NEGATIVE
PROTEIN: NEGATIVE mg/dL
SPECIFIC GRAVITY, URINE: 1.04 — AB (ref 1.005–1.030)
UROBILINOGEN UA: 1 mg/dL (ref 0.0–1.0)
pH: 5.5 (ref 5.0–8.0)

## 2013-07-02 LAB — RAPID URINE DRUG SCREEN, HOSP PERFORMED
AMPHETAMINES: NOT DETECTED
Barbiturates: NOT DETECTED
Benzodiazepines: NOT DETECTED
Cocaine: NOT DETECTED
OPIATES: NOT DETECTED
TETRAHYDROCANNABINOL: POSITIVE — AB

## 2013-07-02 LAB — ETHANOL

## 2013-07-02 MED ORDER — ACETAMINOPHEN 325 MG PO TABS: 650.0000 mg | ORAL_TABLET | ORAL | Status: DC | PRN

## 2013-07-02 MED ORDER — ZOLPIDEM TARTRATE 5 MG PO TABS: 5.0000 mg | ORAL_TABLET | Freq: Every evening | ORAL | Status: DC | PRN

## 2013-07-02 MED ORDER — IBUPROFEN 200 MG PO TABS: 600.0000 mg | ORAL_TABLET | Freq: Three times a day (TID) | ORAL | Status: DC | PRN

## 2013-07-02 MED ORDER — ONDANSETRON HCL 4 MG PO TABS: 4.0000 mg | ORAL_TABLET | Freq: Three times a day (TID) | ORAL | Status: DC | PRN

## 2013-07-02 NOTE — ED Notes (Signed)
Pt belongings x2 bags placed into locker 32 in TCU

## 2013-07-02 NOTE — ED Notes (Signed)
Pt belongings returned to pt and patient states that all belongings present at time of discharge. Pt is in hall pacing and delay explained to pt. Pt is agreeable to wait for his paperwork however he is very restless.

## 2013-07-02 NOTE — Discharge Instructions (Signed)
Depression, Adult °Depression refers to feeling sad, low, down in the dumps, blue, gloomy, or empty. In general, there are two kinds of depression: °1. Depression that we all experience from time to time because of upsetting life experiences, including the loss of a job or the ending of a relationship (normal sadness or normal grief). This kind of depression is considered normal, is short lived, and resolves within a few days to 2 weeks. (Depression experienced after the loss of a loved one is called bereavement. Bereavement often lasts longer than 2 weeks but normally gets better with time.) °2. Clinical depression, which lasts longer than normal sadness or normal grief or interferes with your ability to function at home, at work, and in school. It also interferes with your personal relationships. It affects almost every aspect of your life. Clinical depression is an illness. °Symptoms of depression also can be caused by conditions other than normal sadness and grief or clinical depression. Examples of these conditions are listed as follows: °· Physical illness Some physical illnesses, including underactive thyroid gland (hypothyroidism), severe anemia, specific types of cancer, diabetes, uncontrolled seizures, heart and lung problems, strokes, and chronic pain are commonly associated with symptoms of depression. °· Side effects of some prescription medicine In some people, certain types of prescription medicine can cause symptoms of depression. °· Substance abuse Abuse of alcohol and illicit drugs can cause symptoms of depression. °SYMPTOMS °Symptoms of normal sadness and normal grief include the following: °· Feeling sad or crying for short periods of time. °· Not caring about anything (apathy). °· Difficulty sleeping or sleeping too much. °· No longer able to enjoy the things you used to enjoy. °· Desire to be by oneself all the time (social isolation). °· Lack of energy or motivation. °· Difficulty  concentrating or remembering. °· Change in appetite or weight. °· Restlessness or agitation. °Symptoms of clinical depression include the same symptoms of normal sadness or normal grief and also the following symptoms: °· Feeling sad or crying all the time. °· Feelings of guilt or worthlessness. °· Feelings of hopelessness or helplessness. °· Thoughts of suicide or the desire to harm yourself (suicidal ideation). °· Loss of touch with reality (psychotic symptoms). Seeing or hearing things that are not real (hallucinations) or having false beliefs about your life or the people around you (delusions and paranoia). °DIAGNOSIS  °The diagnosis of clinical depression usually is based on the severity and duration of the symptoms. Your caregiver also will ask you questions about your medical history and substance use to find out if physical illness, use of prescription medicine, or substance abuse is causing your depression. Your caregiver also may order blood tests. °TREATMENT  °Typically, normal sadness and normal grief do not require treatment. However, sometimes antidepressant medicine is prescribed for bereavement to ease the depressive symptoms until they resolve. °The treatment for clinical depression depends on the severity of your symptoms but typically includes antidepressant medicine, counseling with a mental health professional, or a combination of both. Your caregiver will help to determine what treatment is best for you. °Depression caused by physical illness usually goes away with appropriate medical treatment of the illness. If prescription medicine is causing depression, talk with your caregiver about stopping the medicine, decreasing the dose, or substituting another medicine. °Depression caused by abuse of alcohol or illicit drugs abuse goes away with abstinence from these substances. Some adults need professional help in order to stop drinking or using drugs. °SEEK IMMEDIATE CARE IF: °· You have   thoughts  about hurting yourself or others. °· You lose touch with reality (have psychotic symptoms). °· You are taking medicine for depression and have a serious side effect. °FOR MORE INFORMATION °National Alliance on Mental Illness: www.nami.org  °National Institute of Mental Health: www.nimh.nih.gov  °Document Released: 05/31/2000 Document Revised: 12/03/2011 Document Reviewed: 09/02/2011 °ExitCare® Patient Information ©2014 [employer]. ° ° ° °Emergency Department Resource Guide °1) Find a Doctor and Pay Out of Pocket °Although you won't have to find out who is covered by your insurance plan, it is a good idea to ask around and get recommendations. You will then need to call the office and see if the doctor you have chosen will accept you as a new patient and what types of options they offer for patients who are self-pay. Some doctors offer discounts or will set up payment plans for their patients who do not have insurance, but you will need to ask so you aren't surprised when you get to your appointment. ° °2) Contact Your Local Health Department °Not all health departments have doctors that can see patients for sick visits, but many do, so it is worth a call to see if yours does. If you don't know where your local health department is, you can check in your phone book. The CDC also has a tool to help you locate your state's health department, and many state websites also have listings of all of their local health departments. ° °3) Find a Walk-in Clinic °If your illness is not likely to be very severe or complicated, you may want to try a walk in clinic. These are popping up all over the country in pharmacies, drugstores, and shopping centers. They're usually staffed by nurse practitioners or physician assistants that have been trained to treat common illnesses and complaints. They're usually fairly quick and inexpensive. However, if you have serious medical issues or chronic medical problems, these are probably not  your best option. ° °No Primary Care Doctor: °- Call [facility] at  832-8000 - they can help you locate a primary care doctor that  accepts your insurance, provides certain services, etc. °- Physician Referral Service- 1-800-533-3463 ° °Chronic Pain Problems: °Organization         Address  Phone   Notes  °[facility]  (336) 297-2271 Patients need to be referred by their primary care doctor.  ° °Medication Assistance: °Organization         Address  Phone   Notes  °[place] [facility] [address] °[place], [place] [address] (336) 641-8030 --Must be a resident of [place] °-- Must have NO insurance coverage whatsoever (no Medicaid/ Medicare, etc.) °-- The pt. MUST have a primary care doctor that directs their care regularly and follows them in the community °  °[facility]  (866) 331-1348   °[employer]  (888) 892-1162   ° °Agencies that provide inexpensive medical care: °Organization         Address  Phone   Notes  °[facility]  (336) 832-8035   °[facility]    (336) 832-7272   °[facility] [address] °[place], [place] [address] (336) 832-4777   °[facility] [address], °[place] (336) 271-4999   °[facility]    (336) 373-0678   °[facility]    (336) 272-1050   °[facility] ° [address], [place] Phone:  (336)   832-4444, Fax:  (336) 832-4440 Hours of Operation:  9 am - 6 pm, M-F.  Also accepts Medicaid/Medicare and self-pay.  °[facility] ° [address], [place] Phone: (336) 832-3150, Fax: (336) [phone]. Hours of Operation:  8:30 am - 5:30 pm, M-F.  Also accepts Medicaid and self-pay.  °[facility] [place] [address], [place] Phone: (336) 878-6027   °[facility] [address], [place], [place] (336)723-1848, Ext. 123 Mondays & Thursdays: 7-9 AM.  First  15 patients are seen on a first come, first serve basis. °  ° °Medicaid-accepting [place] Providers: ° °Organization         Address  Phone   Notes  °[facility] [address], [place] (336) 641-2100 Also accepts self-pay patients.  °[facility] [address], [place] ° (336) 856-9996   °[facility] [address], [place] (336) 288-8857   °[facility] [address] [place] [address], [place] (336) 299-7000   °Veita Bland [address], [place]  ° (336) 373-1557 Only accepts [place] Access Medicaid patients after they have their name applied to their card.  ° °Self-Pay (no insurance) in [place]: ° °Organization         Address  Phone   Notes  °Sickle Cell Patients, [facility] [address], [place] (336) 832-1970   °[facility] [address], [place] (336) 832-4400   °[facility] [place] ° [address], [place] (336) 992-4800   °[facility]/Dr. Osei-Bonsu ° [address] [place] [address], [place] or [address], [place] (336) 841-8500 Phone number for both [place] and [place] locations is the same.  °[facility] [address], [place] (336) 299-0000   °[facility] [address] [place] [address], [place] or [address] (336) 852-7530 °(336) 878-2260   °[facility] [address], [place] (336) 350-1642, phone; (336) 294-5005, fax Sees patients 1st and 3rd Saturday of every month.  Must not qualify for public or private insurance (i.e. Medicaid, Medicare, [place] Health Choice, Veterans' Benefits) • Household income should be no more than 200% of the poverty level •The clinic cannot treat you if you are pregnant or think you are pregnant • Sexually transmitted diseases are not treated at the clinic.  ° ° °Dental  Care: °Organization         Address  Phone  Notes  °[facility] [address], [place] (336) 641-6152 Accepts children up to age 21 who are enrolled in Medicaid or [place] Health Choice; pregnant women with a Medicaid card; and children who have applied for Medicaid or [place] Health Choice, but were declined, whose parents can pay a reduced fee at time of service.  °[facility] [place]  [address], [place] (336) 641-7733 Accepts children up to age 21 who are enrolled in Medicaid or [place] Health Choice; pregnant women with a Medicaid card; and children who have applied for Medicaid or [place] Health Choice, but were declined, whose parents can pay a reduced fee at time of service.  °[facility] ° [address],  (336) 641-4533 Patients are seen by appointment only. Walk-ins are   not accepted. [facility] will see patients 18 years of age and older. °Monday - Tuesday (8am-5pm) °Most Wednesdays (8:30-5pm) °$30 per visit, cash only  °[facility] ° [address], [place] (336) 641-4533 Patients are seen by appointment only. Walk-ins are not accepted. [facility] will see patients 18 years of age and older. °One Wednesday Evening (Monthly: Volunteer Based).  $30 per visit, cash only  °[facility]  (919) 537-3737 for adults; Children under age 4, call Graduate Pediatric Dentistry at ([phone]) [phone]. Children aged 4-14, please call (919) [phone] to request a pediatric application. ° Dental services are provided in all areas of dental care including fillings, crowns and bridges, complete and partial dentures, implants, gum treatment, root canals, and extractions. Preventive care is also provided. Treatment is provided to both adults and children. °Patients are selected via a lottery and there is often a waiting list. °  °[facility] [address], °[place] ° (336) 763-8833 www.drcivils.com °  °[facility] [address], [place], [place] (336)723-1848, Ext. 123 Second and Fourth Thursday of each month, opens at 6:30 AM; Clinic ends at 9 AM.  Patients are seen on a first-come first-served basis, and a limited number are seen during each clinic.  ° °[facility] ° [address], [place], [place] (336) 723-7904   Eligibility Requirements °You must have lived in [place], or [place] counties for at least the last three months. °  You cannot be eligible for state or federal sponsored healthcare insurance, including Veterans Administration, Medicaid, or Medicare. °  You generally cannot be eligible for healthcare insurance through your employer.  °  How to apply: °Eligibility screenings are held every Tuesday and Wednesday afternoon from 1:00 pm until 4:00 pm. You do not need an appointment for the interview!  °[facility] [address], [place], [place] 336-631-2330   °[facility]  336-342-8273   °[facility]  336-703-3100   °[facility]  336-570-6415   ° °Behavioral Health Resources in the Community: °Intensive Outpatient Programs °Organization         Address  Phone  Notes  °[facility] [address], [place], [place] 336-878-6098   °[facility] [address], [place], [place] 336-832-9800   °[facility] [address], [place], [place] ° 336-882-2125   °[facility] [address],  °[place], [place] 1-800-853-5163 or 336-641-4981   °Substance Abuse Resources °Organization         Address  Phone  Notes  °[facility]  336-882-2125   °[facility]  336-784-9470   °[facility]  336-285-9073   °[facility]  336-845-3988   °Residential & Outpatient Substance Abuse Program  1-800-659-3381    °Psychological Services °Organization         Address  Phone  Notes  °[facility]  336- 832-9600   °[facility]  336- 378-7881   °[facility] [address], [place] 1-800-853-5163 or 336-641-4981   ° °Mobile Crisis Teams °Organization         Address  Phone  Notes  °[facility], Mobile Crisis Care Unit  1-877-626-1772   °Assertive °[facility] ° [address] [place], [place] 336-834-9664   °Sharon DeEsch [address] °[place]  336-554-5454   ° °Self-Help/Support   Groups °Organization         Address  Phone             Notes  °[facility] [place] - variety of support groups  336- 373-1402 Call for more information  °Narcotics Anonymous (NA), [facility] [address], °[place]  2 meetings at this location  ° °Residential Treatment Programs °Organization         Address  Phone  Notes  °[facility] [address],    °[place]  1-866-801-8205   °[facility] ° [address], [place], [place] 704-293-8524   °[facility] [address], [place] 336-845-3988 Admissions: 8am-3pm M-F  °[facility] [address]    °[place], [place] 336-841-1104   °[facility] [address], [place], [place] 336-379-7146   °[facility] [address]  °[place], [place] 336-285-9073   °[facility] Intensive Outpatient [address], [place], [place] 336-852-3033   °[facility] [address]  °[place], [place] 1-877-615-2722 or 336-784-9470   °[facility] [address] [place], [place] 336-227-7417 Accepts Medicaid  °[facility] [address]  °[place] 1-800-659-3381 Substance Abuse/Addiction Treatment  ° °[place] Behavioral Health Resources °Organization         Address  Phone  Notes  °[facility]  (888) 581-9988   °Julie Brannon, PhD [address] [place], [place]   (336) 349-5553 or (336) 951-0000   °[facility]   [address] °[place], [place] (336) 349-4454   °[facility] [address], [place], [place] (336) 342-8316 Insurance/Medicaid/sponsorship through [facility]  °[facility] [address]                                    [place], [place] (336) 342-8316 Therapy/tele-psych/case  °[facility] [address]  ° [place], [place] (336) 349-2233    °Dr. Arfeen  (336) 349-4544   °[facility] [place]  [employer] [place] [facility] 1) [address], [place] °2) [address], [place] °3)  [address], [place] (336) 349-3220 °(336) 342-7768 ° °(336) 342-8140   °[place] [facility] (336) 342-1394 or (336) 342-3537 (After Hours)    ° ° ° °

## 2013-07-02 NOTE — ED Notes (Signed)
Per pt, states he has a lot going on-under a lot of stress-has been treated in past for PTSD-has not been taking meds since then
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
## 2013-07-02 NOTE — ED Notes (Signed)
Pt refused VS  

## 2013-07-02 NOTE — Progress Notes (Signed)
Patient ID: Juan FriskJames Sawyer, male   DOB: 07/25/1975, 38 y.o.   MRN: [id number]  [facility] Spoke with patient's nurse Tresa EndoKelly who will move patient to a more private location so that telepsych can be completed. Will complete telepsych once patient is moved.   Alberteen SamFran Jaleil Renwick, FNP-BC
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
## 2013-07-02 NOTE — ED Provider Notes (Signed)
CSN: [id number]     Arrival date & time 07/02/13  1252 History   First MD Initiated Contact with Patient 07/02/13 1414     Chief Complaint  Patient presents with  . Medical Clearance   (Consider location/radiation/quality/duration/timing/severity/associated sxs/prior Treatment) HPI  Pt presents to the ER with [facility].  Patients girlfriend called [facility] today. The patient feels as though he is under a lot of stress, has a hx of PTSD, and was suicidal yesterday. He still feels down and out. He says that he is trying to provide for his family but enough is enough. He has 3 prior attempts to kill himself; (1) jumping off a building, (2) hanging himself, (3) overdose on medication. He also reports hearing voices and noises as well as being paranoid. Not sure why he is having this he says that these are new symptoms for him. He denies taking any measures to harm himself yesterday or today. Denies ETOH of drug use. Denies HI.  [facility] are recommending inpatient treatment for medication stabilization and crisis management.  Past Medical History  Diagnosis Date  . Peptic ulcer disease   . PTSD (post-traumatic stress disorder)    Past Surgical History  Procedure Laterality Date  . Abdominal surgery     Family History  Problem Relation Age of Onset  . Heart failure Father   . Diabetes Father   . Hypertension Father   . Cancer Father   . Hypertension Mother   . Diabetes Mother   . Cancer Mother    History  Substance Use Topics  . Smoking status: Current Every Day Smoker -- 2.00 packs/day    Types: Cigarettes  . Smokeless tobacco: Current User  . Alcohol Use: Yes     Comment: social    Review of Systems The patient denies anorexia, fever, weight loss,, vision loss, decreased hearing, hoarseness, chest pain, syncope, dyspnea on exertion, peripheral edema, balance deficits, hemoptysis, abdominal pain, melena, hematochezia, severe indigestion/heartburn, hematuria,  incontinence, genital sores, muscle weakness, suspicious skin lesions, transient blindness, difficulty walking, depression, unusual weight change, abnormal bleeding, enlarged lymph nodes, angioedema, and breast masses.  Allergies  Naprosyn and Nsaids  Home Medications   Current Outpatient Rx  Name  Route  Sig  Dispense  Refill  . diphenhydramine-acetaminophen (TYLENOL PM) 25-500 MG TABS   Oral   Take 2 tablets by mouth at bedtime as needed (sleep).          There were no vitals taken for this visit. Physical Exam  Nursing note and vitals reviewed. Constitutional: He appears well-developed and well-nourished. No distress.  HENT:  Head: Normocephalic and atraumatic.  Eyes: Pupils are equal, round, and reactive to light.  Neck: Normal range of motion. Neck supple.  Cardiovascular: Normal rate and regular rhythm.   Pulmonary/Chest: Effort normal.  Abdominal: Soft.  Neurological: He is alert.  Skin: Skin is warm and dry.  Psychiatric: His mood appears anxious. He is not agitated and not actively hallucinating. He exhibits a depressed mood. He expresses suicidal ideation. He expresses no homicidal ideation. He expresses no suicidal plans and no homicidal plans.    ED Course  Procedures (including critical care time) Labs Review Labs Reviewed  CBC WITH DIFFERENTIAL - Abnormal; Notable for the following:    RBC 4.21 (*)    HCT 38.8 (*)    All other components within normal limits  BASIC METABOLIC PANEL  ETHANOL  URINE RAPID DRUG SCREEN (HOSP PERFORMED)  URINALYSIS, ROUTINE W REFLEX MICROSCOPIC  Imaging Review No results found.  EKG Interpretation   None       MDM   1. PTSD (post-traumatic stress disorder)   2. Depression    [facility] has recommended inpatient treatment.  Labs show no concerning findings and patient is medically cleared. Home med rec completed (no home meds) Psych hold orders placed.      Dorthula Matas, PA-C 07/02/13 1438

## 2013-07-05 NOTE — ED Provider Notes (Signed)
Medical screening examination/treatment/procedure(s) were performed by non-physician practitioner and as supervising physician I was immediately available for consultation/collaboration.  EKG Interpretation   None          Richardean Canalavid H Alek Borges, MD 07/05/13 1027

## 2013-11-15 ENCOUNTER — Emergency Department (HOSPITAL_COMMUNITY)
Admission: EM | Admit: 2013-11-15 | Discharge: 2013-11-15 | Disposition: A | Discharge status: Home / Self Care | Payer: No Typology Code available for payment source | Attending: Emergency Medicine | Admitting: Emergency Medicine

## 2013-11-15 ENCOUNTER — Encounter (HOSPITAL_COMMUNITY): Payer: Self-pay | Admitting: Emergency Medicine

## 2013-11-15 DIAGNOSIS — K089 Disorder of teeth and supporting structures, unspecified: Secondary | ICD-10-CM | POA: Insufficient documentation

## 2013-11-15 DIAGNOSIS — F17200 Nicotine dependence, unspecified, uncomplicated: Secondary | ICD-10-CM | POA: Insufficient documentation

## 2013-11-15 DIAGNOSIS — Z8711 Personal history of peptic ulcer disease: Secondary | ICD-10-CM | POA: Insufficient documentation

## 2013-11-15 DIAGNOSIS — K0889 Other specified disorders of teeth and supporting structures: Secondary | ICD-10-CM

## 2013-11-15 DIAGNOSIS — K032 Erosion of teeth: Secondary | ICD-10-CM | POA: Insufficient documentation

## 2013-11-15 DIAGNOSIS — Z8659 Personal history of other mental and behavioral disorders: Secondary | ICD-10-CM | POA: Insufficient documentation

## 2013-11-15 DIAGNOSIS — K029 Dental caries, unspecified: Secondary | ICD-10-CM | POA: Insufficient documentation

## 2013-11-15 MED ORDER — OXYCODONE-ACETAMINOPHEN 5-325 MG PO TABS: 1.0000 | ORAL_TABLET | Freq: Four times a day (QID) | ORAL | Status: DC | PRN

## 2013-11-15 MED ORDER — OXYCODONE-ACETAMINOPHEN 5-325 MG PO TABS
1.0000 | ORAL_TABLET | Freq: Once | ORAL | Status: AC
Start: 2013-11-15 — End: 2013-11-15
  Administered 2013-11-15: 1 via ORAL
  Filled 2013-11-15: qty 1

## 2013-11-15 MED ORDER — PENICILLIN V POTASSIUM 500 MG PO TABS: 500.0000 mg | ORAL_TABLET | Freq: Three times a day (TID) | ORAL | Status: DC

## 2013-11-15 NOTE — Discharge Instructions (Signed)
Return to the ED with any concerns including fever/chills, difficulty breathing or swallowing, vomiting and not able to keep down antibiotics, decreased level of alertness/lethargy, or any other alarming symptoms   Another dentist that you may want to contact is [employer], Biomedical scientist, Phone number [phone]

## 2013-11-15 NOTE — ED Notes (Signed)
Pt c/o dental pain that started around this morning at 2 am. Pt states that pain woke him up from his sleep. Pt states that he has pretty bad teeth and has a hole where food may have gotten stuck and rotted.

## 2013-11-15 NOTE — ED Provider Notes (Signed)
CSN: [id number]     Arrival date & time 11/15/13  0759 History   First MD Initiated Contact with Patient 11/15/13 [phone]     Chief Complaint  Patient presents with  . Dental Pain     (Consider location/radiation/quality/duration/timing/severity/associated sxs/prior Treatment) HPI Pt presents with c/o left upper molar pain.  Pt states he has had pain in this tooth over the past several months, he states last night the pain woke him from sleep.  He has never benn able to see a dentist.   No fever, no difficulty breathing or swallowing.  Pain worse with palpation and chewing.  There are no other associated systemic symptoms, there are no other alleviating or modifying factors.   Past Medical History  Diagnosis Date  . Peptic ulcer disease   . PTSD (post-traumatic stress disorder)    Past Surgical History  Procedure Laterality Date  . Abdominal surgery     Family History  Problem Relation Age of Onset  . Heart failure Father   . Diabetes Father   . Hypertension Father   . Cancer Father   . Hypertension Mother   . Diabetes Mother   . Cancer Mother    History  Substance Use Topics  . Smoking status: Current Every Day Smoker -- 2.00 packs/day    Types: Cigarettes  . Smokeless tobacco: Current User  . Alcohol Use: Yes     Comment: social    Review of Systems ROS reviewed and all otherwise negative except for mentioned in HPI    Allergies  Naprosyn and Nsaids  Home Medications   Prior to Admission medications   Medication Sig Start Date End Date Taking? Authorizing Provider  acetaminophen (TYLENOL EX ST ARTHRITIS PAIN) 500 MG tablet Take 1,000 mg by mouth every 4 (four) hours as needed for mild pain.   Yes Historical Provider, MD  oxyCODONE-acetaminophen (PERCOCET/ROXICET) 5-325 MG per tablet Take 1-2 tablets by mouth every 6 (six) hours as needed for severe pain. 11/15/13   Ethelda Chick, MD  penicillin v potassium (VEETID) 500 MG tablet Take 1 tablet (500 mg total) by  mouth 3 (three) times daily. 11/15/13   Ethelda Chick, MD   BP 117/74  Pulse 83  Temp(Src) 98.1 F (36.7 C) (Oral)  Resp 17  SpO2 98% Vitals reviewed Physical Exam Physical Examination: General appearance - alert, well appearing, and in no distress Mental status - alert, oriented to person, place, and time Eyes - no conjunctival injection, no scleral icterus Mouth - mucous membranes moist, pharynx normal without lesions, dental decay, erosion of left upper molar, no gingival abscess palpable, no swelling under tongue Chest - clear to auscultation, no wheezes, rales or rhonchi, symmetric air entry Heart - normal rate, regular rhythm, normal S1, S2, no murmurs, rubs, clicks or gallops Extremities - peripheral pulses normal, no pedal edema, no clubbing or cyanosis Skin - normal coloration and turgor, no rashes  ED Course  Procedures (including critical care time) Labs Review Labs Reviewed - No data to display  Imaging Review No results found.   EKG Interpretation None      MDM   Final diagnoses:  Pain, dental    Pt presenting with c/o dental pain, pt has chronic dental decay- no signs of abscess.  Pt started on penicillin and given pain medication.  Given information for dental followup and strongly encouraged to see dentist.  Discharged with strict return precautions.  Pt agreeable with plan.    Ethelda Chick, MD  11/15/13 0830 

## 2015-04-15 ENCOUNTER — Encounter (HOSPITAL_COMMUNITY): Payer: Self-pay | Admitting: Emergency Medicine

## 2015-04-15 ENCOUNTER — Emergency Department (HOSPITAL_COMMUNITY)
Admission: EM | Admit: 2015-04-15 | Discharge: 2015-04-16 | Disposition: A | Discharge status: Home / Self Care | Payer: Self-pay | Attending: Emergency Medicine | Admitting: Emergency Medicine

## 2015-04-15 DIAGNOSIS — Z8711 Personal history of peptic ulcer disease: Secondary | ICD-10-CM | POA: Insufficient documentation

## 2015-04-15 DIAGNOSIS — R509 Fever, unspecified: Secondary | ICD-10-CM | POA: Insufficient documentation

## 2015-04-15 DIAGNOSIS — Y9389 Activity, other specified: Secondary | ICD-10-CM | POA: Insufficient documentation

## 2015-04-15 DIAGNOSIS — Y998 Other external cause status: Secondary | ICD-10-CM | POA: Insufficient documentation

## 2015-04-15 DIAGNOSIS — L03811 Cellulitis of head [any part, except face]: Secondary | ICD-10-CM | POA: Insufficient documentation

## 2015-04-15 DIAGNOSIS — T63304A Toxic effect of unspecified spider venom, undetermined, initial encounter: Secondary | ICD-10-CM

## 2015-04-15 DIAGNOSIS — Z720 Tobacco use: Secondary | ICD-10-CM | POA: Insufficient documentation

## 2015-04-15 DIAGNOSIS — S0006XA Insect bite (nonvenomous) of scalp, initial encounter: Secondary | ICD-10-CM | POA: Insufficient documentation

## 2015-04-15 DIAGNOSIS — Z792 Long term (current) use of antibiotics: Secondary | ICD-10-CM | POA: Insufficient documentation

## 2015-04-15 DIAGNOSIS — W57XXXA Bitten or stung by nonvenomous insect and other nonvenomous arthropods, initial encounter: Secondary | ICD-10-CM | POA: Insufficient documentation

## 2015-04-15 DIAGNOSIS — Z8659 Personal history of other mental and behavioral disorders: Secondary | ICD-10-CM | POA: Insufficient documentation

## 2015-04-15 DIAGNOSIS — Y9289 Other specified places as the place of occurrence of the external cause: Secondary | ICD-10-CM | POA: Insufficient documentation

## 2015-04-15 MED ORDER — CEPHALEXIN 500 MG PO CAPS: 500.0000 mg | ORAL_CAPSULE | Freq: Two times a day (BID) | ORAL | Status: DC

## 2015-04-15 MED ORDER — CEPHALEXIN 250 MG PO CAPS
500.0000 mg | ORAL_CAPSULE | Freq: Once | ORAL | Status: AC
  Administered 2015-04-15: 500 mg via ORAL
  Filled 2015-04-15: qty 2

## 2015-04-15 MED ORDER — TRAMADOL HCL 50 MG PO TABS: 50.0000 mg | ORAL_TABLET | Freq: Four times a day (QID) | ORAL | Status: DC | PRN

## 2015-04-15 MED ORDER — TRAMADOL HCL 50 MG PO TABS
50.0000 mg | ORAL_TABLET | Freq: Once | ORAL | Status: AC
  Administered 2015-04-15: 50 mg via ORAL
  Filled 2015-04-15: qty 1

## 2015-04-15 NOTE — ED Provider Notes (Signed)
CSN: [id number]     Arrival date & time 04/15/15  2111 History  By signing my name below, I, Juan Sawyer, attest that this documentation has been prepared under the direction and in the presence of Juan Pel, PA-C. Electronically Signed: Angelene Sawyer, ED Scribe. 04/15/2015. 10:58 PM.    Chief Complaint  Patient presents with  . Insect Bite   The history is provided by the patient. No language interpreter was used.   HPI Comments: Juan Sawyer is a 39 y.o. male who presents to the Emergency Department status post insect bite onset 4 days ago. He reports associated bites on his scalp, left sided facial swelling, and a low grade fever. He denies any myalgia, n/d/v, weakness, or SOB. 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 He explains that he believes a spider bit him but did not see the spider. He reports an allergy to naprosyn and NSAIDS. He denies any known allergy to antibiotics.   ROS: The patient denies diaphoresis, fever, headache, weakness (general or focal), confusion, change of vision,  dysphagia, aphagia, shortness of breath,  abdominal pains, nausea, vomiting, diarrhea, lower extremity swelling, neck pain, chest pain    Past Medical History  Diagnosis Date  . Peptic ulcer disease   . PTSD (post-traumatic stress disorder)    Past Surgical History  Procedure Laterality Date  . Abdominal surgery     Family History  Problem Relation Age of Onset  . Heart failure Father   . Diabetes Father   . Hypertension Father   . Cancer Father   . Hypertension Mother   . Diabetes Mother   . Cancer Mother    Social History  Substance Use Topics  . Smoking status: Current Every Day Smoker -- 0.00 packs/day    Types: Cigarettes  . Smokeless tobacco: Current User  . Alcohol Use: Yes     Comment: social    Review of Systems  Constitutional: Positive for fever (low grade).  HENT: Positive for facial  swelling.   Skin: Positive for wound. Negative for rash.  All other systems reviewed and are negative.     Allergies  Naprosyn and Nsaids  Home Medications   Prior to Admission medications   Medication Sig Start Date End Date Taking? Authorizing Provider  acetaminophen (TYLENOL EX ST ARTHRITIS PAIN) 500 MG tablet Take 1,000 mg by mouth every 4 (four) hours as needed for mild pain.    Historical Provider, MD  cephALEXin (KEFLEX) 500 MG capsule Take 1 capsule (500 mg total) by mouth 2 (two) times daily. 04/15/15   Juan Pel, PA-C  oxyCODONE-acetaminophen (PERCOCET/ROXICET) 5-325 MG per tablet Take 1-2 tablets by mouth every 6 (six) hours as needed for severe pain. 11/15/13   Juan Scott, MD  penicillin v potassium (VEETID) 500 MG tablet Take 1 tablet (500 mg total) by mouth 3 (three) times daily. 11/15/13   Juan Scott, MD  traMADol (ULTRAM) 50 MG tablet Take 1 tablet (50 mg total) by mouth every 6 (six) hours as needed. 04/15/15   Juan Scales Neva Seat, PA-C   BP 116/72 mmHg  Pulse 77  Temp(Src) 99.1 F (37.3 C) (Oral)  Resp 16  Ht  (1.803 m)  Wt 165 lb (74.844 kg)  BMI 23.02 kg/m2  SpO2 97% Physical Exam  Constitutional: He is oriented to person, place, and time. He appears well-developed and well-nourished. No distress.  HENT:  Head: Normocephalic and atraumatic.  Two small areas of cellulitis, with center eschar in place. No fluctuance or swelling. No streaking. No trismus. I was unable to express any fluid or puss from wound. Mild tenderness to palpation.  Eyes: Conjunctivae and EOM are normal.  Neck: Neck supple. No tracheal deviation present.  Cardiovascular: Normal rate.   Pulmonary/Chest: Effort normal. No respiratory distress.  Musculoskeletal: Normal range of motion.  Neurological: He is alert and oriented to person, place, and time.  Skin: Skin is warm and dry.  Psychiatric: He has a normal mood and affect. His behavior is normal.  Nursing note and vitals  reviewed.   ED Course  Procedures (including critical care time) DIAGNOSTIC STUDIES: Oxygen Saturation is 97% on RA, adequate by my interpretation.    COORDINATION OF CARE: 10:44 PM- Pt advised of plan for treatment and pt agrees. Pt will receive antibiotics and pain medication. He will receive his first dose in the ER. Pt advised to use warm compresses as well. Recommended to return if he develops a fever, n/v/d, or any weakness.    EKG Interpretation None      MDM   Final diagnoses:  Spider bite, undetermined intent, initial encounter    Pt is well appearing with no systemic symptoms. Concern for multiple small spider bites to scalp with early infection. Will have him take Benadryl at home and Keflex.  Given return precautions.    Medications  cephALEXin (KEFLEX) capsule 500 mg (500 mg Oral Given 04/15/15 2254)  traMADol (ULTRAM) tablet 50 mg (50 mg Oral Given 04/15/15 2254)    39 y.o.Juan Sawyer's medical screening exam was performed and I feel the patient has had an appropriate workup for their chief complaint at this time and likelihood of emergent condition existing is low. They have been counseled on decision, discharge, follow up and which symptoms necessitate immediate return to the emergency department. They or their family verbally stated understanding and agreement with plan and discharged in stable condition.   Vital signs are stable at discharge. Filed Vitals:   04/15/15 2120  BP: 116/72  Pulse: 77  Temp: 99.1 F (37.3 C)  Resp: 16    I personally performed the services described in this documentation, which was scribed in my presence. The recorded information has been reviewed and is accurate.   Juan Peliffany Graesyn Schreifels, PA-C 04/15/15 2316  Juan Munchobert Lockwood, MD 04/16/15 [phone]

## 2015-04-15 NOTE — Discharge Instructions (Signed)
Spider Bite °Spider bites are not common. When spider bites do happen, most do not cause serious health problems. There are only a few types of spider bites that can cause serious health problems. °CAUSES °A spider bite usually happens when a person accidentally makes contact with a spider in a way that traps the spider against the person's skin. °SYMPTOMS °Symptoms may vary depending on the type of spider. Some spider bites may cause symptoms within 1 hour after the bite. For other spider bites, it may take 1-2 days for symptoms to develop. Common symptoms include: °· Redness and swelling in the area of the bite. °· Discomfort or pain in the area of the bite. °A few types of spiders, such as the black widow spider or the brown recluse spider, can inject poison (venom) into a bite wound. This venom causes more serious symptoms. Symptoms of a venomous spider bite vary, and may include: °· Muscle cramps. °· Nausea, vomiting, or abdominal pain. °· Fever. °· A skin sore (lesion) that spreads. This can break into an open wound (skin ulcer). °· Light-headedness or dizziness. °DIAGNOSIS °This condition may be diagnosed based on your symptoms and a physical exam. Your health care provider will ask about the history of your injury and any details you may have about the spider. This may help to determine what type of spider it was that bit you. °TREATMENT °Many spider bites do not require treatment. If needed, treatment may include: °· Icing and keeping the bite area raised (elevated). °· Over-the-counter or prescription medicines to help control symptoms. °· A tetanus shot. °· Antibiotic medicines. °HOME CARE INSTRUCTIONS °Medicines °· Take or apply over-the-counter and prescription medicines only as told by your health care provider. °· If you were prescribed an antibiotic medicine, take or apply it as told by your health care provider. Do not stop using the antibiotic even if your condition improves. °General  Instructions °· Do not scratch the bite area. °· Keep the bite area clean and dry. Wash the bite area daily with soap and water as told by your health care provider. °· If directed, apply ice to the bite area. °¨ Put ice in a plastic bag. °¨ Place a towel between your skin and the bag. °¨ Leave the ice on for 20 minutes, 2-3 times per day. °· Elevate the affected area above the level of your heart while you are sitting or lying down, if possible. °· Keep all follow-up visits as told by your health care provider. This is important. °SEEK MEDICAL CARE IF: °· Your bite does not get better after 3 days of treatment. °· Your bite turns black or purple. °· You have increased redness, swelling, or pain at the site of the bite. °SEEK IMMEDIATE MEDICAL CARE IF: °· You develop shortness of breath or chest pain. °· You have fluid, blood, or pus coming from the bite area. °· You have muscle cramps or painful muscle spasms. °· You develop abdominal pain, nausea, or vomiting. °· You feel unusually tired (fatigued) or sleepy. °  °This information is not intended to replace advice given to you by your health care provider. Make sure you discuss any questions you have with your health care provider. °  °Document Released: 07/11/2004 Document Revised: 02/22/2015 Document Reviewed: 10/19/2014 °Elsevier Interactive Patient Education ©2016 [employer]vier Inc. ° °

## 2015-04-15 NOTE — ED Notes (Signed)
Pt. reports "spider bite" at scalp x2 this week with mild pain , respirations unlabored  / denies fever .

## 2015-11-24 ENCOUNTER — Encounter (HOSPITAL_COMMUNITY): Payer: Self-pay | Admitting: Emergency Medicine

## 2015-11-24 ENCOUNTER — Emergency Department (HOSPITAL_COMMUNITY): Payer: Managed Care, Other (non HMO)

## 2015-11-24 ENCOUNTER — Emergency Department (HOSPITAL_COMMUNITY)
Admission: EM | Admit: 2015-11-24 | Discharge: 2015-11-24 | Disposition: A | Discharge status: Home / Self Care | Payer: Managed Care, Other (non HMO) | Attending: Emergency Medicine | Admitting: Emergency Medicine

## 2015-11-24 DIAGNOSIS — R0781 Pleurodynia: Secondary | ICD-10-CM | POA: Diagnosis present

## 2015-11-24 DIAGNOSIS — M25511 Pain in right shoulder: Secondary | ICD-10-CM | POA: Diagnosis not present

## 2015-11-24 DIAGNOSIS — R079 Chest pain, unspecified: Secondary | ICD-10-CM

## 2015-11-24 DIAGNOSIS — Z79899 Other long term (current) drug therapy: Secondary | ICD-10-CM | POA: Insufficient documentation

## 2015-11-24 DIAGNOSIS — F17210 Nicotine dependence, cigarettes, uncomplicated: Secondary | ICD-10-CM | POA: Diagnosis not present

## 2015-11-24 DIAGNOSIS — R0789 Other chest pain: Secondary | ICD-10-CM | POA: Insufficient documentation

## 2015-11-24 LAB — COMPREHENSIVE METABOLIC PANEL
ALT: 16 U/L — AB (ref 17–63)
AST: 24 U/L (ref 15–41)
Albumin: 4.1 g/dL (ref 3.5–5.0)
Alkaline Phosphatase: 54 U/L (ref 38–126)
Anion gap: 6 (ref 5–15)
BILIRUBIN TOTAL: 0.9 mg/dL (ref 0.3–1.2)
BUN: 14 mg/dL (ref 6–20)
CALCIUM: 8.6 mg/dL — AB (ref 8.9–10.3)
CO2: 23 mmol/L (ref 22–32)
Chloride: 105 mmol/L (ref 101–111)
Creatinine, Ser: 1.06 mg/dL (ref 0.61–1.24)
GFR calc non Af Amer: >60 mL/min (ref >60)
GLUCOSE: 94 mg/dL (ref 65–99)
Potassium: 3.9 mmol/L (ref 3.5–5.1)
SODIUM: 134 mmol/L — AB (ref 135–145)
Total Protein: 7.5 g/dL (ref 6.5–8.1)

## 2015-11-24 LAB — CBC WITH DIFFERENTIAL/PLATELET
Basophils Absolute: 0 10*3/uL (ref 0.0–0.1)
Basophils Relative: 0 %
EOS ABS: 0.1 10*3/uL (ref 0.0–0.7)
Eosinophils Relative: 1 %
HEMATOCRIT: 40.7 % (ref 39.0–52.0)
HEMOGLOBIN: 13.9 g/dL (ref 13.0–17.0)
LYMPHS ABS: 3.4 10*3/uL (ref 0.7–4.0)
LYMPHS PCT: 40 %
MCH: 31.2 pg (ref 26.0–34.0)
MCHC: 34.2 g/dL (ref 30.0–36.0)
MCV: 91.3 fL (ref 78.0–100.0)
Monocytes Absolute: 0.6 10*3/uL (ref 0.1–1.0)
Monocytes Relative: 7 %
NEUTROS PCT: 52 %
Neutro Abs: 4.6 10*3/uL (ref 1.7–7.7)
Platelets: 392 10*3/uL (ref 150–400)
RBC: 4.46 MIL/uL (ref 4.22–5.81)
RDW: 13.7 % (ref 11.5–15.5)
WBC: 8.7 10*3/uL (ref 4.0–10.5)

## 2015-11-24 LAB — D-DIMER, QUANTITATIVE (NOT AT ARMC)

## 2015-11-24 LAB — I-STAT TROPONIN, ED: TROPONIN I, POC: 0 ng/mL (ref 0.00–0.08)

## 2015-11-24 MED ORDER — HYDROCODONE-ACETAMINOPHEN 5-325 MG PO TABS
1.0000 | ORAL_TABLET | Freq: Once | ORAL | Status: AC
  Administered 2015-11-24: 1 via ORAL
  Filled 2015-11-24: qty 1

## 2015-11-24 NOTE — ED Notes (Addendum)
Pt reports right ribcage pain worse with movement and deep breath onset with waking up; denies cough.

## 2015-11-24 NOTE — Discharge Instructions (Signed)
Chest Pain Observation °It is often hard to give a specific diagnosis for the cause of chest pain. Among other possibilities your symptoms might be caused by inadequate oxygen delivery to your heart (angina). Angina that is not treated or evaluated can lead to a heart attack (myocardial infarction) or death. °Blood tests, electrocardiograms, and X-rays may have been done to help determine a possible cause of your chest pain. After evaluation and observation, your health care provider has determined that it is unlikely your pain was caused by an unstable condition that requires hospitalization. However, a full evaluation of your pain may need to be completed, with additional diagnostic testing as directed. It is very important to keep your follow-up appointments. Not keeping your follow-up appointments could result in permanent heart damage, disability, or death. If there is any problem keeping your follow-up appointments, you must call your health care provider. °HOME CARE INSTRUCTIONS  °Due to the slight chance that your pain could be angina, it is important to follow your health care provider's treatment plan and also maintain a healthy lifestyle: °· Maintain or work toward achieving a healthy weight. °· Stay physically active and exercise regularly. °· Decrease your salt intake. °· Eat a balanced, healthy diet. Talk to a dietitian to learn about heart-healthy foods. °· Increase your fiber intake by including whole grains, vegetables, fruits, and nuts in your diet. °· Avoid situations that cause stress, anger, or depression. °· Take medicines as advised by your health care provider. Report any side effects to your health care provider. Do not stop medicines or adjust the dosages on your own. °· Quit smoking. Do not use nicotine patches or gum until you check with your health care provider. °· Keep your blood pressure, blood sugar, and cholesterol levels within normal limits. °· Limit alcohol intake to no more than  1 drink per day for women who are not pregnant and 2 drinks per day for men. °· Do not abuse drugs. °SEEK IMMEDIATE MEDICAL CARE IF: °You have severe chest pain or pressure which may include symptoms such as: °· You feel pain or pressure in your arms, neck, jaw, or back. °· You have severe back or abdominal pain, feel sick to your stomach (nauseous), or throw up (vomit). °· You are sweating profusely. °· You are having a fast or irregular heartbeat. °· You feel short of breath while at rest. °· You notice increasing shortness of breath during rest, sleep, or with activity. °· You have chest pain that does not get better after rest or after taking your usual medicine. °· You wake from sleep with chest pain. °· You are unable to sleep because you cannot breathe. °· You develop a frequent cough or you are coughing up blood. °· You feel dizzy, faint, or experience extreme fatigue. °· You develop severe weakness, dizziness, fainting, or chills. °Any of these symptoms may represent a serious problem that is an emergency. Do not wait to see if the symptoms will go away. Call your local emergency services (911 in the [place] Do not drive yourself to the hospital. °MAKE SURE YOU: °· Understand these instructions. °· Will watch your condition. °· Will get help right away if you are not doing well or get worse. °  °This information is not intended to replace advice given to you by your health care provider. Make sure you discuss any questions you have with your health care provider. °  °Document Released: 07/06/2010 Document Revised: 06/08/2013 Document Reviewed: 12/03/2012 °Elsevier Interactive Patient   Education 2016 [employer]  Joint Pain Joint pain, which is also called arthralgia, can be caused by many things. Joint pain often goes away when you follow your health care provider's instructions for relieving pain at home. However, joint pain can also be caused by conditions that require further treatment. Common causes  of joint pain include:  Bruising in the area of the joint.  Overuse of the joint.  Wear and tear on the joints that occur with aging (osteoarthritis).  Various other forms of arthritis.  A buildup of a crystal form of uric acid in the joint (gout).  Infections of the joint (septic arthritis) or of the bone (osteomyelitis). Your health care provider may recommend medicine to help with the pain. If your joint pain continues, additional tests may be needed to diagnose your condition. HOME CARE INSTRUCTIONS Watch your condition for any changes. Follow these instructions as directed to lessen the pain that you are feeling.  Take medicines only as directed by your health care provider.  Rest the affected area for as long as your health care provider says that you should. If directed to do so, raise the painful joint above the level of your heart while you are sitting or lying down.  Do not do things that cause or worsen pain.  If directed, apply ice to the painful area:  Put ice in a plastic bag.  Place a towel between your skin and the bag.  Leave the ice on for 20 minutes, 2-3 times per day.  Wear an elastic bandage, splint, or sling as directed by your health care provider. Loosen the elastic bandage or splint if your fingers or toes become numb and tingle, or if they turn cold and blue.  Begin exercising or stretching the affected area as directed by your health care provider. Ask your health care provider what types of exercise are safe for you.  Keep all follow-up visits as directed by your health care provider. This is important. SEEK MEDICAL CARE IF:  Your pain increases, and medicine does not help.  Your joint pain does not improve within 3 days.  You have increased bruising or swelling.  You have a fever.  You lose 10 lb (4.5 kg) or more without trying. SEEK IMMEDIATE MEDICAL CARE IF:  You are not able to move the joint.  Your fingers or toes become numb or they  turn cold and blue.   This information is not intended to replace advice given to you by your health care provider. Make sure you discuss any questions you have with your health care provider.   Document Released: 06/03/2005 Document Revised: 06/24/2014 Document Reviewed: 03/15/2014 Elsevier Interactive Patient Education [employer]2016 [employer] Inc.

## 2015-11-24 NOTE — ED Notes (Signed)
PA at bedside.

## 2015-11-24 NOTE — ED Notes (Signed)
Pt reports pain in R lower rib cage with moving arm, neck and coughing. Has these flare ups intermittently that resolve after several days of rest.

## 2015-11-24 NOTE — ED Provider Notes (Signed)
CSN: [id number]     Arrival date & time 11/24/15  1256 History   First MD Initiated Contact with Patient 11/24/15 1304     Chief Complaint  Patient presents with  . Ribcage Pain    HPI  Juan Sawyer is a 40 year old male with PMHx PUD and PTSD presenting with chest pain and right shoulder pain. He states that he had onset of back and chest pain upon waking this morning. The pain is underneath his right shoulder blade and radiates through to his anterior chest. The pain is sharp and shooting. He states that deep breathing, coughing and sneezing exacerbate the pain. He states that when he tries to breathe slowly to alleviate the pain he then has a burning sensation in the same distribution. Patient states he was mowing the yard with a push mower this afternoon when he felt onset of right shoulder pain. The pain is at his anterior shoulder and shoots down into his wrist. The pain is only on the dorsal surface of his arm and is in a "straight line that I can trace". Shoulder pain is exacerbated by moving his arm. The pain is intermittent. He reports history of rotator cuff pain on the left arm and states this feels similar. He notes that moving his right shoulder exacerbates the chest pain as well. He has taken Tylenol without relief. He denies shortness of breath but states that he is trying to breathe shallowly to prevent pain. Denies associated dizziness, syncope, neck pain, nausea, vomiting or any other acute symptoms with onset of his chest pain. He states that this has happened in the past and resting for several days resolved the pain. Denies history of blood clots. Denies lower extremity swelling or posterior calf pain. Denies recent surgery, immobility or long travel. Denies cardiac history.  Past Medical History  Diagnosis Date  . Peptic ulcer disease   . PTSD (post-traumatic stress disorder)    Past Surgical History  Procedure Laterality Date  . Abdominal surgery     Family History  Problem  Relation Age of Onset  . Heart failure Father   . Diabetes Father   . Hypertension Father   . Cancer Father   . Hypertension Mother   . Diabetes Mother   . Cancer Mother    Social History  Substance Use Topics  . Smoking status: Current Every Day Smoker -- 1.00 packs/day    Types: Cigarettes  . Smokeless tobacco: Current User  . Alcohol Use: Yes     Comment: social    Review of Systems  All other systems reviewed and are negative.     Allergies  Naprosyn and Nsaids  Home Medications   Prior to Admission medications   Medication Sig Start Date End Date Taking? Authorizing Provider  acetaminophen (TYLENOL EX ST ARTHRITIS PAIN) 500 MG tablet Take 1,000 mg by mouth every 4 (four) hours as needed for mild pain.   Yes Historical Provider, MD  cephALEXin (KEFLEX) 500 MG capsule Take 1 capsule (500 mg total) by mouth 2 (two) times daily. Patient not taking: Reported on 11/24/2015 04/15/15   Marlon Pel, PA-C  oxyCODONE-acetaminophen (PERCOCET/ROXICET) 5-325 MG per tablet Take 1-2 tablets by mouth every 6 (six) hours as needed for severe pain. Patient not taking: Reported on 11/24/2015 11/15/13   Jerelyn Scott, MD  penicillin v potassium (VEETID) 500 MG tablet Take 1 tablet (500 mg total) by mouth 3 (three) times daily. Patient not taking: Reported on 11/24/2015 11/15/13  Jerelyn ScottMartha Linker, MD  traMADol (ULTRAM) 50 MG tablet Take 1 tablet (50 mg total) by mouth every 6 (six) hours as needed. Patient not taking: Reported on 11/24/2015 04/15/15   Marlon Peliffany Greene, PA-C   BP 128/79 mmHg  Pulse 60  Temp(Src) 98.3 F (36.8 C) (Oral)  Resp 16  SpO2 100% Physical Exam  Constitutional: He appears well-developed and well-nourished. No distress.  HENT:  Head: Normocephalic and atraumatic.  Eyes: Conjunctivae are normal. Right eye exhibits no discharge. Left eye exhibits no discharge. No scleral icterus.  Neck: Normal range of motion.  Cardiovascular: Normal rate, regular rhythm, normal heart  sounds and intact distal pulses.   Radial pulse palpable  Pulmonary/Chest: Effort normal and breath sounds normal. No respiratory distress. He has no wheezes. He has no rales. He exhibits no tenderness.  Breathing unlabored. Lungs clear to auscultation bilaterally. There is no chest wall tenderness to palpation.  Abdominal: Soft. He exhibits no distension. There is no tenderness.  Musculoskeletal: Normal range of motion.       Right shoulder: He exhibits normal range of motion, no tenderness, no swelling, no deformity, normal pulse and normal strength.  No tenderness to palpation of the right shoulder. Full range of motion of the right upper extremity intact. Positive Hawkins. Negative Neer's. Moves upper extremities freely and emphatically during interview. No unilateral calf swelling. No posterior calf tenderness palpation. Negative Homan's.  Neurological: He is alert. Coordination normal.  5/5 strength of the bilateral upper extremities. Sensation and light touch intact throughout.  Skin: Skin is warm and dry.  Psychiatric: He has a normal mood and affect. His behavior is normal.  Nursing note and vitals reviewed.   ED Course  Procedures (including critical care time) Labs Review Labs Reviewed  COMPREHENSIVE METABOLIC PANEL - Abnormal; Notable for the following:    Sodium 134 (*)    Calcium 8.6 (*)    ALT 16 (*)    All other components within normal limits  CBC WITH DIFFERENTIAL/PLATELET  D-DIMER, QUANTITATIVE (NOT AT [facility])  Rosezena SensorI-STAT TROPOININ, ED    Imaging Review Dg Chest 2 View  11/24/2015  CLINICAL DATA:  One week history of chest pain. EXAM: CHEST  2 VIEW COMPARISON:  05/11/2011 FINDINGS: The heart size and mediastinal contours are within normal limits. Both lungs are clear. The visualized skeletal structures are unremarkable. IMPRESSION: Normal chest x-ray. Electronically Signed   By: Rudie MeyerP.  Gallerani M.D.   On: 11/24/2015 13:46   I have personally reviewed and evaluated these  images and lab results as part of my medical decision-making.   EKG Interpretation   Date/Time:  Friday November 24 2015 13:38:09 EDT Ventricular Rate:  66 PR Interval:    QRS Duration: 100 QT Interval:  398 QTC Calculation: 417 R Axis:   53 Text Interpretation:  Sinus rhythm RSR' in V1 or V2, probably normal  variant Confirmed by DELO  MD, DOUGLAS ([id number]) on 11/24/2015 2:44:32 PM      MDM   Final diagnoses:  Chest pain, unspecified chest pain type  Right shoulder pain   Patient presenting with atypical pleuritic chest pain and right shoulder pain. Patient is to be discharged with recommendation to follow up with PCP in regards to today's hospital visit. VSS. Heart RRR without murmur. Lungs CTAB. No chest wall tenderness. Right upper extremity is neurovascularly intact with FROM. No TTP at right shoulder. Positive Hawkin's but negative Neer's. Troponin negative with non-iscehmic ECG. CXR without abnormality. Negative D dimer. Patient has a low heart  score. Presentation and workup is not consistent with cardiac or pulmonary pain origin. Pt has been advised to return to the ED if CP becomes exertional, associated with diaphoresis or nausea, radiates to left jaw/arm, worsens or becomes concerning in any way. No PCP noted in chart; given referral for PCP follow up. Instructed to follow up with orthopedics for shoulder pain if it does not improve with conservative therapy. Pt appears reliable for follow up and is agreeable to discharge.   Case has been discussed with and seen by Dr. Judd Lien who agrees with the above plan to discharge.   Filed Vitals:   11/24/15 1301 11/24/15 1453  BP: 135/82 128/79  Pulse: 73 60  Temp: 98.3 F (36.8 C)   TempSrc: Oral   Resp: 16 16  SpO2: 98% 100%       Alveta Heimlich, PA-C 11/24/15 1454  Geoffery Lyons, MD 11/24/15 1506

## 2016-12-21 IMAGING — CR DG CHEST 2V
2 series · 2 of 2 positions shown · non-contrast
Comparison: 05/11/2011

CLINICAL DATA: One week history of chest pain.

EXAM:
CHEST  2 VIEW

[w chest pa]
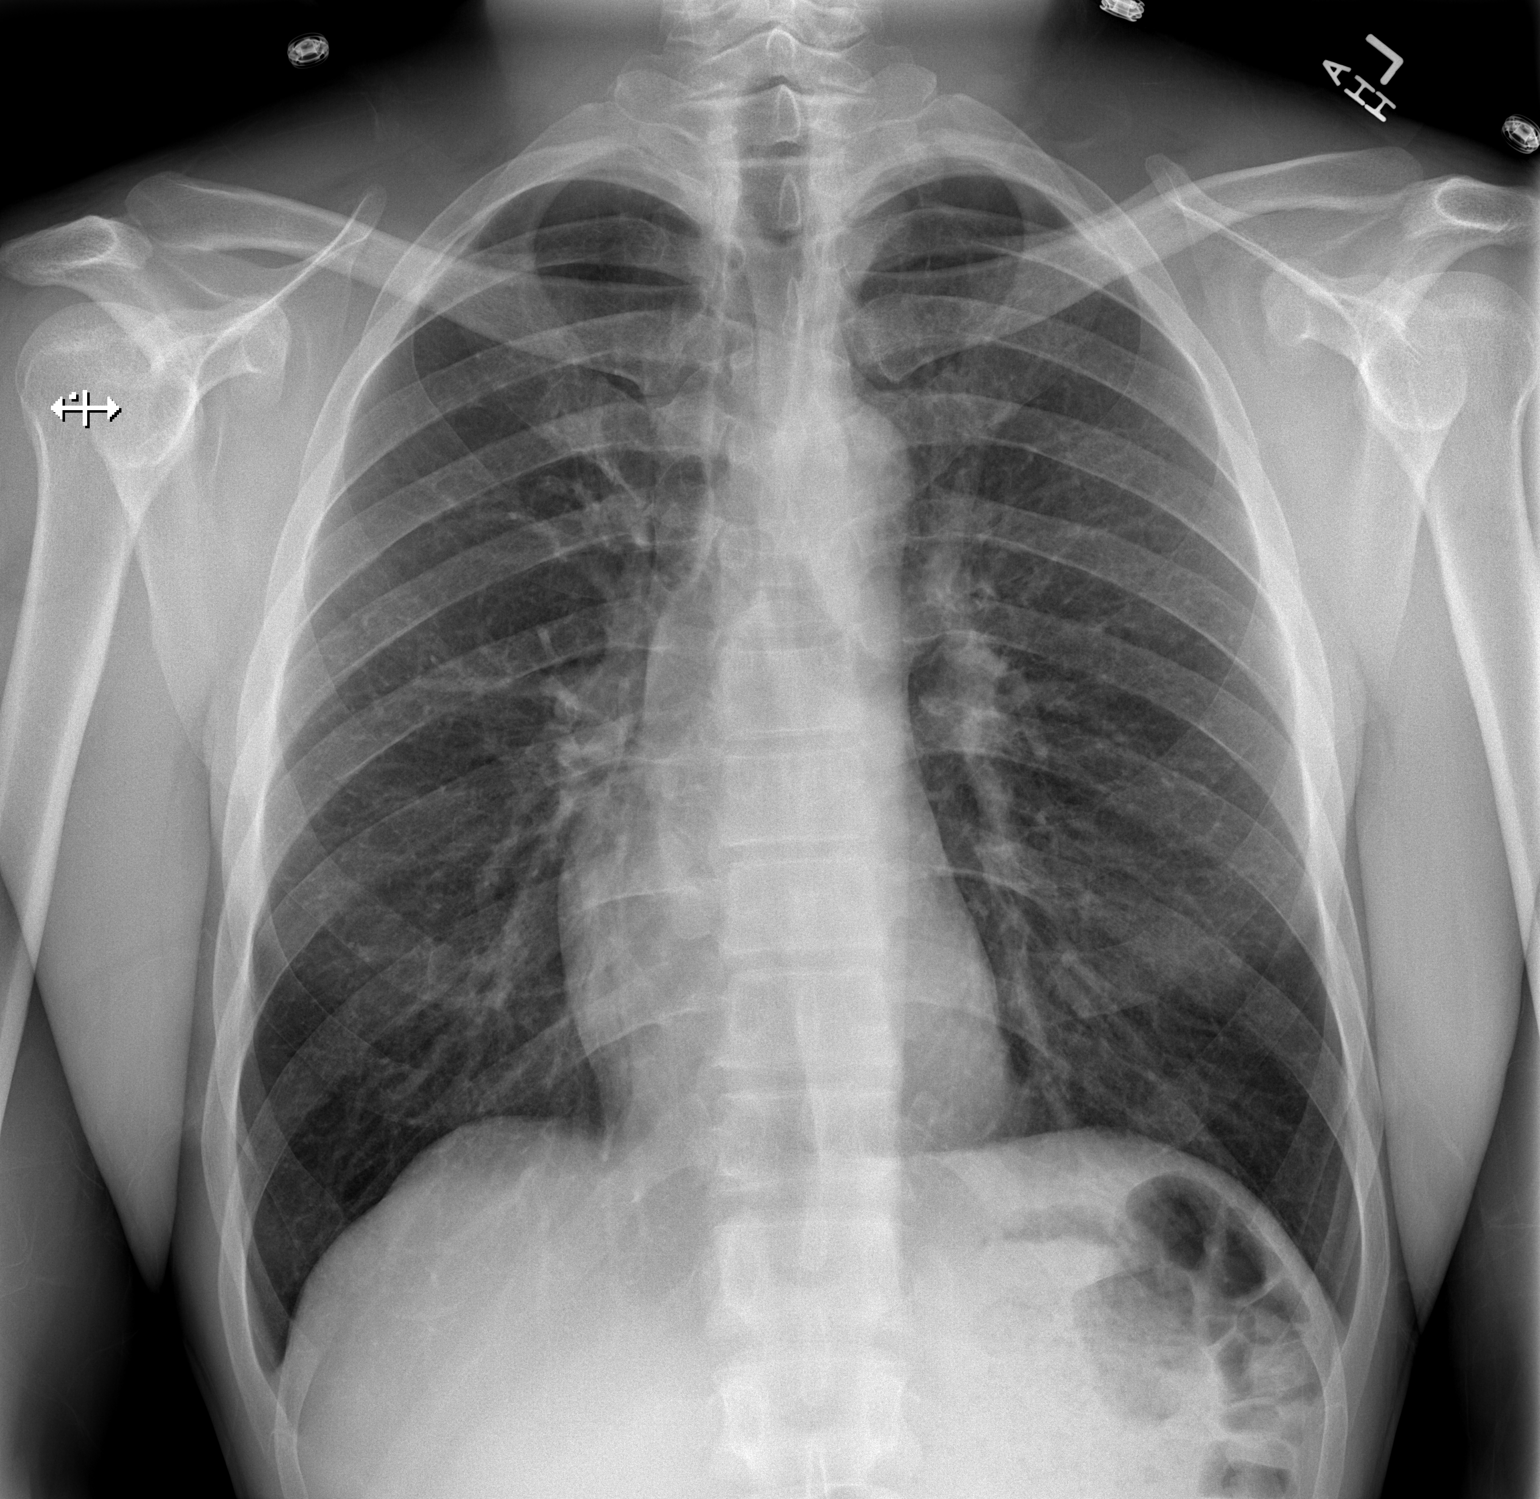

[w chest lat]
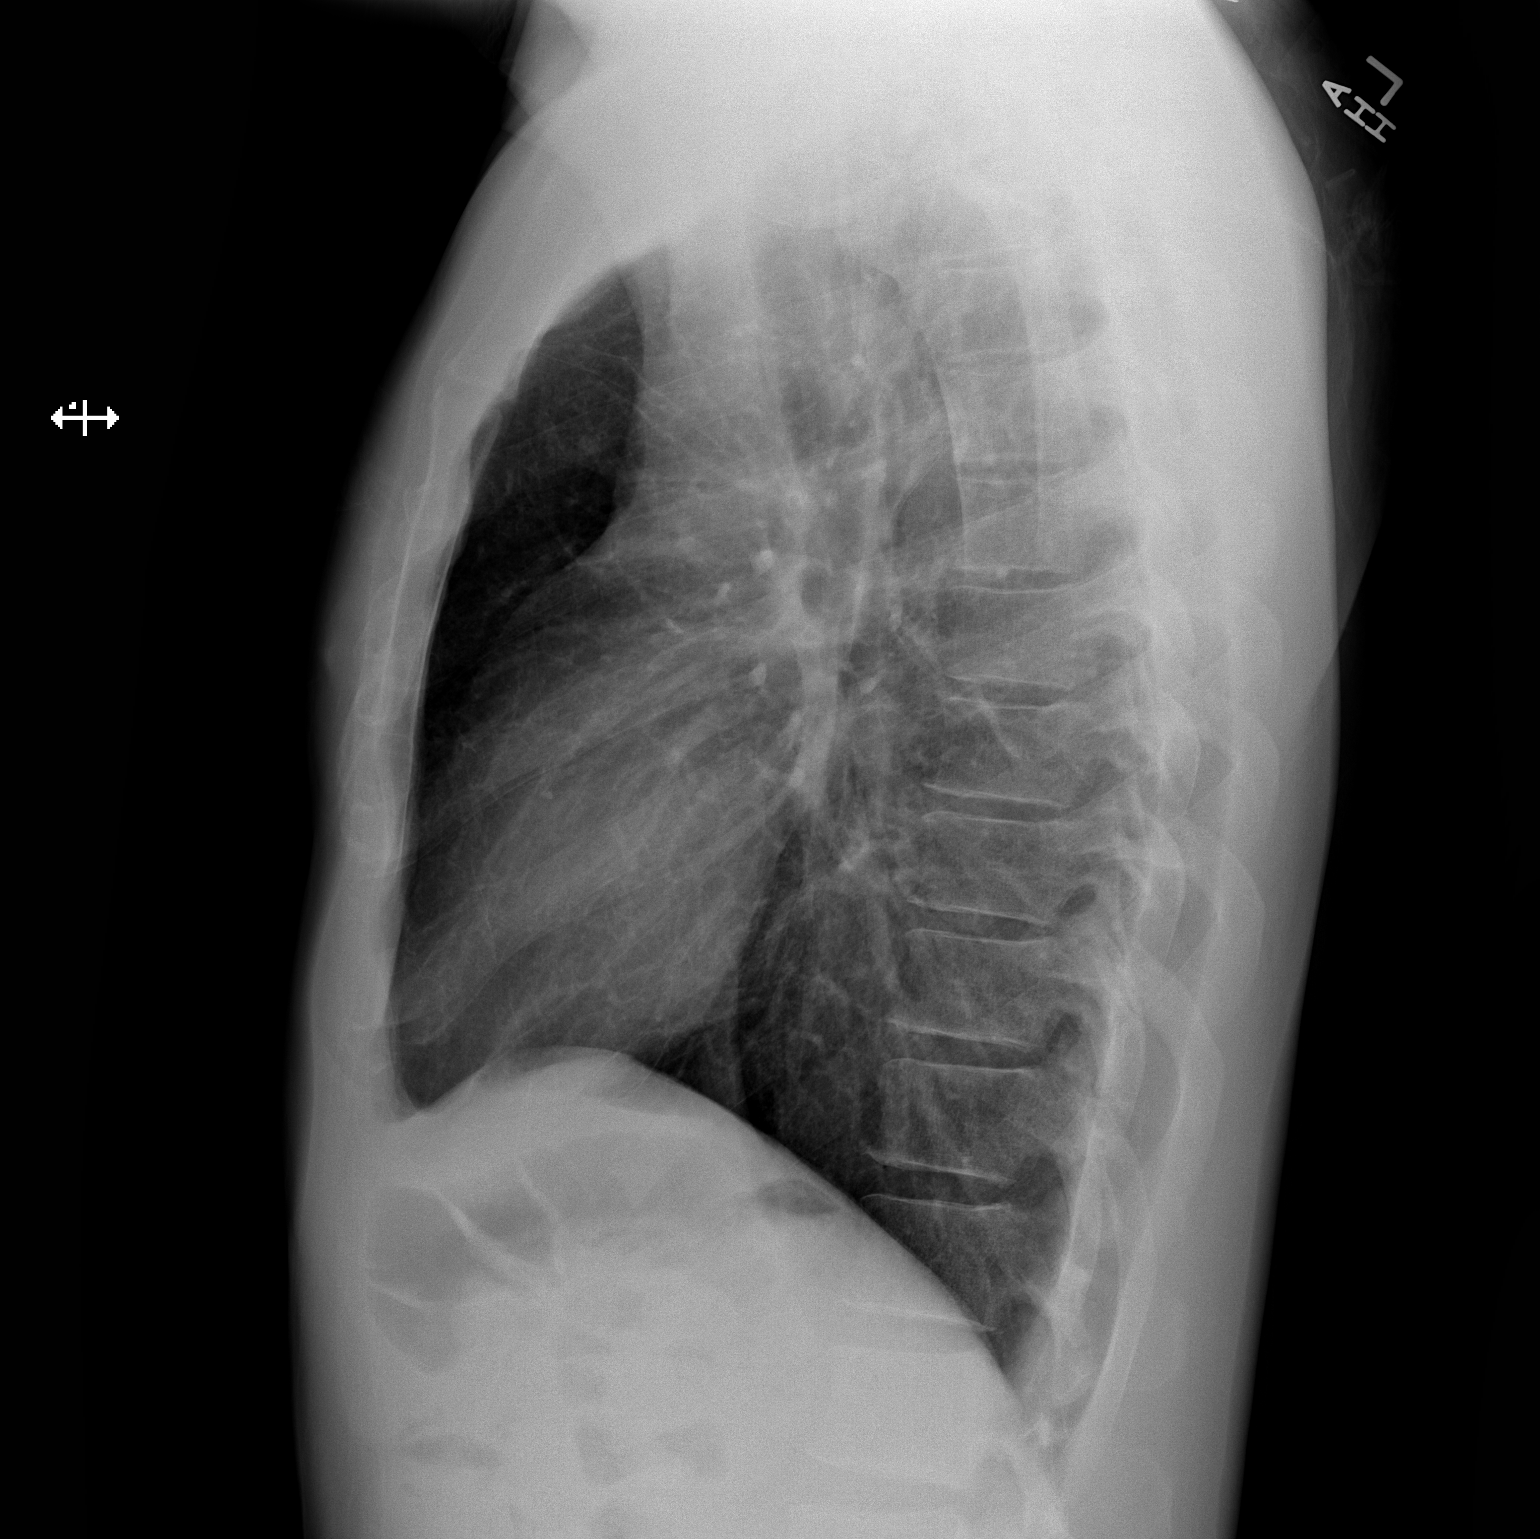

[2 of 2 positions shown; findings below may reference images not displayed]

FINDINGS: The heart size and mediastinal contours are within normal limits.
Both lungs are clear. The visualized skeletal structures are
unremarkable.
IMPRESSION: Normal chest x-ray.

## 2017-05-21 ENCOUNTER — Emergency Department (HOSPITAL_COMMUNITY)
Admission: EM | Admit: 2017-05-21 | Discharge: 2017-05-21 | Disposition: A | Discharge status: Home / Self Care | Payer: Managed Care, Other (non HMO) | Attending: Emergency Medicine | Admitting: Emergency Medicine

## 2017-05-21 ENCOUNTER — Encounter (HOSPITAL_COMMUNITY): Payer: Self-pay | Admitting: Emergency Medicine

## 2017-05-21 ENCOUNTER — Other Ambulatory Visit: Payer: Self-pay

## 2017-05-21 DIAGNOSIS — R109 Unspecified abdominal pain: Secondary | ICD-10-CM | POA: Insufficient documentation

## 2017-05-21 DIAGNOSIS — Z5321 Procedure and treatment not carried out due to patient leaving prior to being seen by health care provider: Secondary | ICD-10-CM | POA: Insufficient documentation

## 2017-05-21 LAB — COMPREHENSIVE METABOLIC PANEL
ALT: 16 U/L — ABNORMAL LOW (ref 17–63)
ANION GAP: 6 (ref 5–15)
AST: 19 U/L (ref 15–41)
Albumin: 3.4 g/dL — ABNORMAL LOW (ref 3.5–5.0)
Alkaline Phosphatase: 62 U/L (ref 38–126)
BILIRUBIN TOTAL: 0.6 mg/dL (ref 0.3–1.2)
BUN: 10 mg/dL (ref 6–20)
CALCIUM: 8.6 mg/dL — AB (ref 8.9–10.3)
CO2: 25 mmol/L (ref 22–32)
Chloride: 106 mmol/L (ref 101–111)
Creatinine, Ser: 1.08 mg/dL (ref 0.61–1.24)
Glucose, Bld: 93 mg/dL (ref 65–99)
POTASSIUM: 4.1 mmol/L (ref 3.5–5.1)
Sodium: 137 mmol/L (ref 135–145)
TOTAL PROTEIN: 6.7 g/dL (ref 6.5–8.1)

## 2017-05-21 LAB — CBC
HEMATOCRIT: 39.9 % (ref 39.0–52.0)
HEMOGLOBIN: 13.5 g/dL (ref 13.0–17.0)
MCH: 31.6 pg (ref 26.0–34.0)
MCHC: 33.8 g/dL (ref 30.0–36.0)
MCV: 93.4 fL (ref 78.0–100.0)
Platelets: 412 10*3/uL — ABNORMAL HIGH (ref 150–400)
RBC: 4.27 MIL/uL (ref 4.22–5.81)
RDW: 13.4 % (ref 11.5–15.5)
WBC: 9.7 10*3/uL (ref 4.0–10.5)

## 2017-05-21 LAB — LIPASE, BLOOD: Lipase: 26 U/L (ref 11–51)

## 2017-05-21 NOTE — ED Notes (Signed)
Called for room no answer

## 2017-05-21 NOTE — ED Triage Notes (Signed)
Pt to ER for evaluation of right upper quadrant abdominal pain onset Friday. VSS. NAD. Tender on palpation.

## 2017-05-21 NOTE — ED Notes (Signed)
Called pt no answer °

## 2023-03-20 ENCOUNTER — Emergency Department (HOSPITAL_COMMUNITY): Payer: Medicaid Other

## 2023-03-20 ENCOUNTER — Other Ambulatory Visit: Payer: Self-pay

## 2023-03-20 ENCOUNTER — Emergency Department (HOSPITAL_COMMUNITY)
Admission: EM | Admit: 2023-03-20 | Discharge: 2023-03-21 | Disposition: A | Discharge status: Home / Self Care | Payer: Medicaid Other | Attending: Emergency Medicine | Admitting: Emergency Medicine

## 2023-03-20 DIAGNOSIS — J0110 Acute frontal sinusitis, unspecified: Secondary | ICD-10-CM | POA: Diagnosis not present

## 2023-03-20 DIAGNOSIS — D72829 Elevated white blood cell count, unspecified: Secondary | ICD-10-CM | POA: Insufficient documentation

## 2023-03-20 DIAGNOSIS — R519 Headache, unspecified: Secondary | ICD-10-CM | POA: Diagnosis present

## 2023-03-20 LAB — CBC WITH DIFFERENTIAL/PLATELET
Abs Immature Granulocytes: 0.03 10*3/uL (ref 0.00–0.07)
Basophils Absolute: 0.1 10*3/uL (ref 0.0–0.1)
Basophils Relative: 1 %
Eosinophils Absolute: 0 10*3/uL (ref 0.0–0.5)
Eosinophils Relative: 0 %
HCT: 39.3 % (ref 39.0–52.0)
Hemoglobin: 13.1 g/dL (ref 13.0–17.0)
Immature Granulocytes: 0 %
Lymphocytes Relative: 32 %
Lymphs Abs: 4 10*3/uL (ref 0.7–4.0)
MCH: 30.8 pg (ref 26.0–34.0)
MCHC: 33.3 g/dL (ref 30.0–36.0)
MCV: 92.3 fL (ref 80.0–100.0)
Monocytes Absolute: 1 10*3/uL (ref 0.1–1.0)
Monocytes Relative: 8 %
Neutro Abs: 7.3 10*3/uL (ref 1.7–7.7)
Neutrophils Relative %: 59 %
Platelets: 370 10*3/uL (ref 150–400)
RBC: 4.26 MIL/uL (ref 4.22–5.81)
RDW: 14.5 % (ref 11.5–15.5)
WBC: 12.3 10*3/uL — ABNORMAL HIGH (ref 4.0–10.5)
nRBC: 0 % (ref 0.0–0.2)

## 2023-03-20 LAB — COMPREHENSIVE METABOLIC PANEL
ALT: 34 U/L (ref 0–44)
AST: 33 U/L (ref 15–41)
Albumin: 3.7 g/dL (ref 3.5–5.0)
Alkaline Phosphatase: 71 U/L (ref 38–126)
Anion gap: 9 (ref 5–15)
BUN: 14 mg/dL (ref 6–20)
CO2: 24 mmol/L (ref 22–32)
Calcium: 8.8 mg/dL — ABNORMAL LOW (ref 8.9–10.3)
Chloride: 104 mmol/L (ref 98–111)
Creatinine, Ser: 0.95 mg/dL (ref 0.61–1.24)
GFR, Estimated: >60 mL/min (ref >60)
Glucose, Bld: 91 mg/dL (ref 70–99)
Potassium: 4 mmol/L (ref 3.5–5.1)
Sodium: 137 mmol/L (ref 135–145)
Total Bilirubin: 0.8 mg/dL (ref 0.3–1.2)
Total Protein: 7.4 g/dL (ref 6.5–8.1)

## 2023-03-20 NOTE — ED Triage Notes (Signed)
Patient reports fatigue with headache , chills and productive cough this week . Respirations unlabored .

## 2023-03-21 MED ORDER — AMOXICILLIN-POT CLAVULANATE 875-125 MG PO TABS: 1.0000 | ORAL_TABLET | Freq: Two times a day (BID) | ORAL | 0 refills | Status: DC

## 2023-03-21 MED ORDER — ONDANSETRON 4 MG PO TBDP: ORAL_TABLET | ORAL | 0 refills | Status: DC

## 2023-03-21 MED ORDER — BENZONATATE 100 MG PO CAPS: 100.0000 mg | ORAL_CAPSULE | Freq: Three times a day (TID) | ORAL | 0 refills | Status: DC

## 2023-03-21 NOTE — ED Provider Notes (Signed)
[place] EMERGENCY DEPARTMENT AT [facility] Provider Note   CSN: [id number] Arrival date & time: 03/20/23  2254     History  Chief Complaint  Patient presents with   Fatigue / Headache     Cough    Juan Sawyer is a 47 y.o. male.  47 yo M with a chief complaints of cough congestion fevers chills myalgias and headache.  Is been going on for about 3 or 4 days now.  No known sick contacts.  No recent travel.  Denies any significant difficulty breathing.  Has had some nausea off and on but denies significant vomiting.        Home Medications Prior to Admission medications   Medication Sig Start Date End Date Taking? Authorizing Provider  amoxicillin-clavulanate (AUGMENTIN) 875-125 MG tablet Take 1 tablet by mouth every 12 (twelve) hours. 03/21/23  Yes Melene Plan, DO  benzonatate (TESSALON) 100 MG capsule Take 1 capsule (100 mg total) by mouth every 8 (eight) hours. 03/21/23  Yes Melene Plan, DO  ondansetron (ZOFRAN-ODT) 4 MG disintegrating tablet 4mg  ODT q4 hours prn nausea/vomit 03/21/23  Yes Melene Plan, DO  acetaminophen (TYLENOL EX ST ARTHRITIS PAIN) 500 MG tablet Take 1,000 mg by mouth every 4 (four) hours as needed for mild pain.    [provider]  oxyCODONE-acetaminophen (PERCOCET/ROXICET) 5-325 MG per tablet Take 1-2 tablets by mouth every 6 (six) hours as needed for severe pain. Patient not taking: Reported on 11/24/2015 11/15/13   Phillis Haggis, MD  traMADol (ULTRAM) 50 MG tablet Take 1 tablet (50 mg total) by mouth every 6 (six) hours as needed. Patient not taking: Reported on 11/24/2015 04/15/15   Marlon Pel, PA-C      Allergies    Naprosyn [naproxen] and Nsaids    Review of Systems   Review of Systems  Physical Exam Updated Vital Signs BP 132/71 (BP Location: Right Arm)   Pulse 82   Temp 98.4 F (36.9 C) (Oral)   Resp (!) 24   Ht 5\' 10"  (1.778 m)   Wt 74.8 kg   SpO2 100%   BMI 23.68 kg/m  Physical Exam Vitals and nursing note  reviewed.  Constitutional:      Appearance: He is well-developed.  HENT:     Head: Normocephalic and atraumatic.     Comments: Swollen turbinates, posterior nasal drip, exquisite tenderness to percussion over the left frontal sinus, tm normal bilaterally.   Eyes:     Pupils: Pupils are equal, round, and reactive to light.  Neck:     Vascular: No JVD.  Cardiovascular:     Rate and Rhythm: Normal rate and regular rhythm.     Heart sounds: No murmur heard.    No friction rub. No gallop.  Pulmonary:     Effort: No respiratory distress.     Breath sounds: No wheezing.  Abdominal:     General: There is no distension.     Tenderness: There is no abdominal tenderness. There is no guarding or rebound.  Musculoskeletal:        General: Normal range of motion.     Cervical back: Normal range of motion and neck supple.  Skin:    Coloration: Skin is not pale.     Findings: No rash.  Neurological:     Mental Status: He is alert and oriented to person, place, and time.  Psychiatric:        Behavior: Behavior normal.     ED Results /  Procedures / Treatments   Labs (all labs ordered are listed, but only abnormal results are displayed) Labs Reviewed  CBC WITH DIFFERENTIAL/PLATELET - Abnormal; Notable for the following components:      Result Value   WBC 12.3 (*)    All other components within normal limits  COMPREHENSIVE METABOLIC PANEL - Abnormal; Notable for the following components:   Calcium 8.8 (*)    All other components within normal limits    EKG None  Radiology DG Chest 2 View  Result Date: 03/20/2023 CLINICAL DATA:  Cough weakness headache EXAM: CHEST - 2 VIEW COMPARISON:  11/24/2015 FINDINGS: The heart size and mediastinal contours are within normal limits. Both lungs are clear. The visualized skeletal structures are unremarkable. IMPRESSION: No active cardiopulmonary disease. Electronically Signed   By: Jasmine Pang M.D.   On: 03/20/2023 23:26     Procedures Procedures    Medications Ordered in ED Medications - No data to display  ED Course/ Medical Decision Making/ A&P                                 Medical Decision Making Amount and/or Complexity of Data Reviewed Labs: ordered. Radiology: ordered.  Risk Prescription drug management.   47 yo M with a cc of cough, congestion, headache subjective fevers and chills.    Mild leukocytosis, no significant electrolyte abnormalities.  Chest x-ray independently interpreted by me without focal infiltrate or pneumothorax.  Clinically with viral syndrome.  Concern for sinusitis on exam.  Start on abx.  PCP follow up.  12:49 AM:  I have discussed the diagnosis/risks/treatment options with the patient.  Evaluation and diagnostic testing in the emergency department does not suggest an emergent condition requiring admission or immediate intervention beyond what has been performed at this time.  They will follow up with PCP. We also discussed returning to the ED immediately if new or worsening sx occur. We discussed the sx which are most concerning (e.g., sudden worsening pain, fever, inability to tolerate by mouth) that necessitate immediate return. Medications administered to the patient during their visit and any new prescriptions provided to the patient are listed below.  Medications given during this visit Medications - No data to display   The patient appears reasonably screen and/or stabilized for discharge and I doubt any other medical condition or other [facility] requiring further screening, evaluation, or treatment in the ED at this time prior to discharge.          Final Clinical Impression(s) / ED Diagnoses Final diagnoses:  Acute frontal sinusitis, recurrence not specified    Rx / DC Orders ED Discharge Orders          Ordered    amoxicillin-clavulanate (AUGMENTIN) 875-125 MG tablet  Every 12 hours        03/21/23 0043    benzonatate (TESSALON) 100 MG capsule   Every 8 hours        03/21/23 0043    ondansetron (ZOFRAN-ODT) 4 MG disintegrating tablet        03/21/23 0043              Melene Plan, DO 03/21/23 1610

## 2023-03-21 NOTE — Discharge Instructions (Signed)
Take tylenol 2 pills 4 times a day and motrin 4 pills 3 times a day.  Drink plenty of fluids.  Return for worsening shortness of breath, headache, confusion. Follow up with your family doctor.  ° °

## 2023-06-22 ENCOUNTER — Other Ambulatory Visit: Payer: Self-pay

## 2023-06-22 ENCOUNTER — Emergency Department (HOSPITAL_COMMUNITY)
Admission: EM | Admit: 2023-06-22 | Discharge: 2023-06-22 | Discharge status: Against Medical Advice | Payer: No Typology Code available for payment source | Attending: Emergency Medicine | Admitting: Emergency Medicine

## 2023-06-22 ENCOUNTER — Emergency Department (HOSPITAL_COMMUNITY): Payer: No Typology Code available for payment source

## 2023-06-22 DIAGNOSIS — G8929 Other chronic pain: Secondary | ICD-10-CM | POA: Diagnosis not present

## 2023-06-22 DIAGNOSIS — R234 Changes in skin texture: Secondary | ICD-10-CM | POA: Insufficient documentation

## 2023-06-22 DIAGNOSIS — M79672 Pain in left foot: Secondary | ICD-10-CM | POA: Diagnosis not present

## 2023-06-22 DIAGNOSIS — L989 Disorder of the skin and subcutaneous tissue, unspecified: Secondary | ICD-10-CM | POA: Diagnosis not present

## 2023-06-22 NOTE — ED Provider Notes (Signed)
 [place] EMERGENCY DEPARTMENT AT [facility] Provider Note   CSN: [id number] Arrival date & time: 06/22/23  0725     History  Chief Complaint  Patient presents with   Foot Pain    Juan Sawyer is a 48 y.o. male.   Foot Pain     Patient has a history of peptic ulcer disease PTSD.  He presents ED with complaints of foot pain.  Patient states he has had a wound at the top of his left foot in between his fourth and fifth toe.  Patient states has been there for months.  He has noticed some intermittent blood and drainage.  Patient states it hurts when he walks.  Patient was concerned about infection so he came to the ED  Home Medications Prior to Admission medications   Medication Sig Start Date End Date Taking? Authorizing Provider  acetaminophen  (TYLENOL  EX ST ARTHRITIS PAIN) 500 MG tablet Take 1,000 mg by mouth every 4 (four) hours as needed for mild pain.    [provider]  amoxicillin -clavulanate (AUGMENTIN ) 875-125 MG tablet Take 1 tablet by mouth every 12 (twelve) hours. 03/21/23   Emil Share, DO  benzonatate  (TESSALON ) 100 MG capsule Take 1 capsule (100 mg total) by mouth every 8 (eight) hours. 03/21/23   Emil Share, DO  ondansetron  (ZOFRAN -ODT) 4 MG disintegrating tablet 4mg  ODT q4 hours prn nausea/vomit 03/21/23   Floyd, Dan, DO  oxyCODONE -acetaminophen  (PERCOCET/ROXICET) 5-325 MG per tablet Take 1-2 tablets by mouth every 6 (six) hours as needed for severe pain. Patient not taking: Reported on 11/24/2015 11/15/13   Tharon Glendale CROME, MD  traMADol  (ULTRAM ) 50 MG tablet Take 1 tablet (50 mg total) by mouth every 6 (six) hours as needed. Patient not taking: Reported on 11/24/2015 04/15/15   Levora Riggs, PA-C      Allergies    Naprosyn  [naproxen ] and Nsaids    Review of Systems   Review of Systems  Physical Exam Updated Vital Signs BP 110/68 (BP Location: Right Arm)   Pulse (!) 57   Temp 97.8 F (36.6 C) (Oral)   Resp 16   SpO2 97%  Physical  Exam Vitals and nursing note reviewed.  Constitutional:      General: He is not in acute distress.    Appearance: He is well-developed.  HENT:     Head: Normocephalic and atraumatic.     Right Ear: External ear normal.     Left Ear: External ear normal.  Eyes:     General: No scleral icterus.       Right eye: No discharge.        Left eye: No discharge.     Conjunctiva/sclera: Conjunctivae normal.  Neck:     Trachea: No tracheal deviation.  Cardiovascular:     Rate and Rhythm: Normal rate.  Pulmonary:     Effort: Pulmonary effort is normal. No respiratory distress.     Breath sounds: No stridor.  Abdominal:     General: There is no distension.  Musculoskeletal:        General: No swelling or deformity.     Cervical back: Neck supple.     Comments: Superficial scab noted between fourth and fifth toe, approximately 3 to 4 mm oval sized, no ulceration noted no surrounding erythema or induration, no purulent drainage  Skin:    General: Skin is warm and dry.     Findings: No rash.  Neurological:     Mental Status: He is alert. Mental  status is at baseline.     Cranial Nerves: No dysarthria or facial asymmetry.     Motor: No seizure activity.     ED Results / Procedures / Treatments   Labs (all labs ordered are listed, but only abnormal results are displayed) Labs Reviewed - No data to display  EKG None  Radiology No results found.  Procedures Procedures    Medications Ordered in ED Medications - No data to display  ED Course/ Medical Decision Making/ A&P                                 Medical Decision Making Amount and/or Complexity of Data Reviewed Radiology: ordered.  Patient presented with complaints of wound to his foot.  Overall it appeared to be superficial in nature.  He had a small dry scab.  X-rays were ordered to rule out any signs of underlying osteomyelitis although overall low suspicion. Patient was still holding in the ED waiting for his x-ray  results.  Patient decided he did not want to wait any longer and left the ED before his x-ray results.          Final Clinical Impression(s) / ED Diagnoses Final diagnoses:  Scab    Rx / DC Orders ED Discharge Orders     None         Randol Simmonds, MD 06/22/23 1039

## 2023-06-22 NOTE — ED Triage Notes (Signed)
 Pt reports chronic L foot wound at the base of his fourth and fifth toes for months. Pt denies known injury. Area does not appear reddened during triage. Pt denies drainage currently. Painful when walking per pt.

## 2023-06-22 NOTE — ED Notes (Signed)
 Patient seen by Lynelle Doctor MD eloping, no discharge instructions provided by RN

## 2023-06-25 ENCOUNTER — Emergency Department (HOSPITAL_COMMUNITY)
Admission: EM | Admit: 2023-06-25 | Discharge: 2023-06-25 | Discharge status: Against Medical Advice | Payer: Medicaid Other | Attending: Emergency Medicine | Admitting: Emergency Medicine

## 2023-06-25 DIAGNOSIS — Z5321 Procedure and treatment not carried out due to patient leaving prior to being seen by health care provider: Secondary | ICD-10-CM | POA: Insufficient documentation

## 2023-06-25 DIAGNOSIS — R112 Nausea with vomiting, unspecified: Secondary | ICD-10-CM | POA: Insufficient documentation

## 2023-06-25 DIAGNOSIS — R6883 Chills (without fever): Secondary | ICD-10-CM | POA: Diagnosis not present

## 2023-06-25 DIAGNOSIS — R109 Unspecified abdominal pain: Secondary | ICD-10-CM | POA: Insufficient documentation

## 2023-06-25 LAB — COMPREHENSIVE METABOLIC PANEL
ALT: 24 U/L (ref 0–44)
AST: 24 U/L (ref 15–41)
Albumin: 3.4 g/dL — ABNORMAL LOW (ref 3.5–5.0)
Alkaline Phosphatase: 54 U/L (ref 38–126)
Anion gap: 10 (ref 5–15)
BUN: 16 mg/dL (ref 6–20)
CO2: 21 mmol/L — ABNORMAL LOW (ref 22–32)
Calcium: 8.7 mg/dL — ABNORMAL LOW (ref 8.9–10.3)
Chloride: 106 mmol/L (ref 98–111)
Creatinine, Ser: 0.97 mg/dL (ref 0.61–1.24)
GFR, Estimated: >60 mL/min (ref >60)
Glucose, Bld: 99 mg/dL (ref 70–99)
Potassium: 4 mmol/L (ref 3.5–5.1)
Sodium: 137 mmol/L (ref 135–145)
Total Bilirubin: 0.9 mg/dL (ref 0.0–1.2)
Total Protein: 7 g/dL (ref 6.5–8.1)

## 2023-06-25 LAB — CBC WITH DIFFERENTIAL/PLATELET
Abs Immature Granulocytes: 0.05 10*3/uL (ref 0.00–0.07)
Basophils Absolute: 0.1 10*3/uL (ref 0.0–0.1)
Basophils Relative: 0 %
Eosinophils Absolute: 0 10*3/uL (ref 0.0–0.5)
Eosinophils Relative: 0 %
HCT: 44.8 % (ref 39.0–52.0)
Hemoglobin: 14.8 g/dL (ref 13.0–17.0)
Immature Granulocytes: 0 %
Lymphocytes Relative: 11 %
Lymphs Abs: 1.5 10*3/uL (ref 0.7–4.0)
MCH: 31.2 pg (ref 26.0–34.0)
MCHC: 33 g/dL (ref 30.0–36.0)
MCV: 94.3 fL (ref 80.0–100.0)
Monocytes Absolute: 0.4 10*3/uL (ref 0.1–1.0)
Monocytes Relative: 3 %
Neutro Abs: 11.8 10*3/uL — ABNORMAL HIGH (ref 1.7–7.7)
Neutrophils Relative %: 86 %
Platelets: 355 10*3/uL (ref 150–400)
RBC: 4.75 MIL/uL (ref 4.22–5.81)
RDW: 13.7 % (ref 11.5–15.5)
WBC: 13.8 10*3/uL — ABNORMAL HIGH (ref 4.0–10.5)
nRBC: 0 % (ref 0.0–0.2)

## 2023-06-25 LAB — URINALYSIS, ROUTINE W REFLEX MICROSCOPIC
Bilirubin Urine: NEGATIVE
Glucose, UA: NEGATIVE mg/dL
Hgb urine dipstick: NEGATIVE
Ketones, ur: NEGATIVE mg/dL
Leukocytes,Ua: NEGATIVE
Nitrite: NEGATIVE
Protein, ur: NEGATIVE mg/dL
Specific Gravity, Urine: 1.027 (ref 1.005–1.030)
pH: 5 (ref 5.0–8.0)

## 2023-06-25 LAB — LIPASE, BLOOD: Lipase: 31 U/L (ref 11–51)

## 2023-06-25 NOTE — ED Notes (Signed)
 Name called again, no answer. This RN also called over to CT to make sure the patient was not over there. Pt is not currently at CT, either.

## 2023-06-25 NOTE — ED Notes (Signed)
 Pt's name called for IV start. No answer.

## 2023-06-25 NOTE — ED Notes (Signed)
 Name called x2 for IV start; no answer.

## 2023-06-25 NOTE — ED Triage Notes (Signed)
 Pt c/o generalized abd pain with N/V/D that started this morning. Pt also c/o chills.

## 2023-06-25 NOTE — ED Provider Triage Note (Signed)
 Emergency Medicine Provider Triage Evaluation Note  Midas Daughety , a 48 y.o. male  was evaluated in triage.  Pt complains of abdominal pain, nausea, and vomiting.  Started yesterday.  Currently eating potato chips during triage.  States he has some chills yesterday as well.  Review of Systems  Positive: As above Negative: As above  Physical Exam  BP 115/79   Pulse 72   Temp 98.5 F (36.9 C) (Oral)   Resp 17   SpO2 97%  Gen:   Awake, no distress   Resp:  Normal effort  MSK:   Moves extremities without difficulty  Other:    Medical Decision Making  Medically screening exam initiated at 1:19 PM.  Appropriate orders placed.  Ruddy Swire was informed that the remainder of the evaluation will be completed by another provider, this initial triage assessment does not replace that evaluation, and the importance of remaining in the ED until their evaluation is complete.    Hildegard Loge, PA-C 06/25/23 1320

## 2023-06-28 ENCOUNTER — Other Ambulatory Visit: Payer: Self-pay

## 2023-06-28 ENCOUNTER — Emergency Department (HOSPITAL_COMMUNITY)
Admission: EM | Admit: 2023-06-28 | Discharge: 2023-06-28 | Disposition: A | Discharge status: Home / Self Care | Payer: Medicaid Other | Attending: Emergency Medicine | Admitting: Emergency Medicine

## 2023-06-28 ENCOUNTER — Encounter (HOSPITAL_COMMUNITY): Payer: Self-pay | Admitting: *Deleted

## 2023-06-28 DIAGNOSIS — F1990 Other psychoactive substance use, unspecified, uncomplicated: Secondary | ICD-10-CM | POA: Insufficient documentation

## 2023-06-28 DIAGNOSIS — F1420 Cocaine dependence, uncomplicated: Secondary | ICD-10-CM | POA: Diagnosis present

## 2023-06-28 LAB — COMPREHENSIVE METABOLIC PANEL
ALT: 17 U/L (ref 0–44)
AST: 18 U/L (ref 15–41)
Albumin: 3.2 g/dL — ABNORMAL LOW (ref 3.5–5.0)
Alkaline Phosphatase: 46 U/L (ref 38–126)
Anion gap: 6 (ref 5–15)
BUN: 24 mg/dL — ABNORMAL HIGH (ref 6–20)
CO2: 21 mmol/L — ABNORMAL LOW (ref 22–32)
Calcium: 8.4 mg/dL — ABNORMAL LOW (ref 8.9–10.3)
Chloride: 107 mmol/L (ref 98–111)
Creatinine, Ser: 0.88 mg/dL (ref 0.61–1.24)
GFR, Estimated: >60 mL/min (ref >60)
Glucose, Bld: 100 mg/dL — ABNORMAL HIGH (ref 70–99)
Potassium: 3.7 mmol/L (ref 3.5–5.1)
Sodium: 134 mmol/L — ABNORMAL LOW (ref 135–145)
Total Bilirubin: 0.5 mg/dL (ref 0.0–1.2)
Total Protein: 6.6 g/dL (ref 6.5–8.1)

## 2023-06-28 LAB — CBC
HCT: 39.3 % (ref 39.0–52.0)
Hemoglobin: 13.3 g/dL (ref 13.0–17.0)
MCH: 31.4 pg (ref 26.0–34.0)
MCHC: 33.8 g/dL (ref 30.0–36.0)
MCV: 92.7 fL (ref 80.0–100.0)
Platelets: 341 10*3/uL (ref 150–400)
RBC: 4.24 MIL/uL (ref 4.22–5.81)
RDW: 13.5 % (ref 11.5–15.5)
WBC: 10.2 10*3/uL (ref 4.0–10.5)
nRBC: 0 % (ref 0.0–0.2)

## 2023-06-28 LAB — ETHANOL: Alcohol, Ethyl (B): <10 mg/dL (ref <10)

## 2023-06-28 MED ORDER — ONDANSETRON HCL 4 MG PO TABS: 4.0000 mg | ORAL_TABLET | Freq: Three times a day (TID) | ORAL | Status: DC | PRN

## 2023-06-28 MED ORDER — LORAZEPAM 1 MG PO TABS: 1.0000 mg | ORAL_TABLET | Freq: Four times a day (QID) | ORAL | Status: DC | PRN

## 2023-06-28 MED ORDER — NICOTINE 21 MG/24HR TD PT24: 21.0000 mg | MEDICATED_PATCH | Freq: Every day | TRANSDERMAL | Status: DC

## 2023-06-28 MED ORDER — ACETAMINOPHEN 325 MG PO TABS: 650.0000 mg | ORAL_TABLET | ORAL | Status: DC | PRN

## 2023-06-28 NOTE — ED Provider Notes (Signed)
 [place] EMERGENCY DEPARTMENT AT [facility] Provider Note   CSN: [id number] Arrival date & time: 06/28/23  0112     History  Chief Complaint  Patient presents with   Psychiatric Evaluation    Hardy Harcum is a 48 y.o. male.  The history is provided by the patient.  Pt with h/o substance use disorder presents with feeling depressed.  He reports he needs to speak to someone about his condition.  No SI or HI is reported.  He admits to recent cocaine use He had recent vomiting but that has improved No cp/abd pain     Past Medical History:  Diagnosis Date   Peptic ulcer disease    PTSD (post-traumatic stress disorder)     Home Medications Prior to Admission medications   Medication Sig Start Date End Date Taking? Authorizing Provider  acetaminophen  (TYLENOL  EX ST ARTHRITIS PAIN) 500 MG tablet Take 1,000 mg by mouth every 4 (four) hours as needed for mild pain.    [provider]      Allergies    Naprosyn  [naproxen ] and Nsaids    Review of Systems   Review of Systems  Constitutional:  Negative for fever.  Psychiatric/Behavioral:  Negative for suicidal ideas.     Physical Exam Updated Vital Signs BP 110/77 (BP Location: Right Arm)   Pulse 88   Temp 97.7 F (36.5 C) (Oral)   Resp 17   Ht 1.778 m (5' 10)   Wt 74.8 kg   SpO2 96%   BMI 23.66 kg/m  Physical Exam CONSTITUTIONAL: Well developed/well nourished, disheveled, no distress HEAD: Normocephalic/atraumatic ENMT: Mucous membranes moist NECK: supple no meningeal signs CV: S1/S2 noted, no murmurs/rubs/gallops noted LUNGS: Lungs are clear to auscultation bilaterally, no apparent distress ABDOMEN: soft, nontender NEURO: Pt is awake/alert/appropriate, moves all extremitiesx4.  No facial droop.   SKIN: warm, color normal PSYCH: no abnormalities of mood noted, alert and oriented to situation  ED Results / Procedures / Treatments   Labs (all labs ordered are listed, but only abnormal  results are displayed) Labs Reviewed  COMPREHENSIVE METABOLIC PANEL - Abnormal; Notable for the following components:      Result Value   Sodium 134 (*)    CO2 21 (*)    Glucose, Bld 100 (*)    BUN 24 (*)    Calcium 8.4 (*)    Albumin 3.2 (*)    All other components within normal limits  ETHANOL  CBC  RAPID URINE DRUG SCREEN, HOSP PERFORMED    EKG None  Radiology No results found.  Procedures Procedures    Medications Ordered in ED Medications  acetaminophen  (TYLENOL ) tablet 650 mg (has no administration in time range)  ondansetron  (ZOFRAN ) tablet 4 mg (has no administration in time range)  nicotine  (NICODERM CQ  - dosed in mg/24 hours) patch 21 mg (has no administration in time range)  LORazepam  (ATIVAN ) tablet 1 mg (has no administration in time range)    ED Course/ Medical Decision Making/ A&P Clinical Course as of 06/28/23 0327  Sat Jun 28, 2023  0326 Patient presents reporting feeling depressed and also recent cocaine use.  He is in no acute distress, has no pain at this time.  Labs overall reassuring.  Patient has been able to eat. Will consult psychiatry [DW]  [phone] The patient has been placed in psychiatric observation due to the need to provide a safe environment for the patient while obtaining psychiatric consultation and evaluation, as well as ongoing medical and  medication management to treat the patient's condition.  The patient has not been placed under full IVC at this time.   [DW]    Clinical Course User Index [DW] Midge Golas, MD                                 Medical Decision Making Amount and/or Complexity of Data Reviewed Labs: ordered.  Risk OTC drugs. Prescription drug management.           Final Clinical Impression(s) / ED Diagnoses Final diagnoses:  Substance use disorder    Rx / DC Orders ED Discharge Orders     None         Midge Golas, MD 06/28/23 [phone]

## 2023-06-28 NOTE — ED Provider Notes (Signed)
 Patient seen by behavioral health, he is safe for discharge.   Zadie Rhine, MD 06/28/23 0600

## 2023-06-28 NOTE — ED Notes (Signed)
 Pt unable to urinate at this time.

## 2023-06-28 NOTE — ED Triage Notes (Signed)
 The pt reports that he last did cocaine yesterday  no alcohol

## 2023-06-28 NOTE — BH Assessment (Addendum)
 Comprehensive Clinical Assessment (CCA) Note  06/28/2023 Juan Sawyer [id number] Disposition: Clinician discussed patient care with Kathryne Show, NP.  She recommended outpatient resources for patient to address his substance use.  Patient does not meet inpatient care criteria at this time.  Clinician informed Dr. Midge of disposition recommendation via secure messaging.  Patient is drowsy during assessment.  He is oriented x4 but his eye contact is fleeting due to his somnolence.  Patient is not responding to internal stimuli nor much external stimuli.  Patient does not evidence any delusitonal thought processes.  He speaks in a low tone and with slowed cadence due to being sleepy.  Patient said he does not usually sleep well, probably secondary to homelessness.  Patient hs no current outpatient care.     Chief Complaint:  Chief Complaint  Patient presents with   Psychiatric Evaluation   Visit Diagnosis: Cocaine use d/o severe    CCA Screening, Triage and Referral (STR)  Patient Reported Information How did you hear about us ? Self (Pt walked to [facility].)  What Is the Reason for Your Visit/Call Today? Pt walked from where he was staying over to [facility].  He is homeless.  Pt wants help with his cocaine addiction.  Patient uses as much as crack as he can get every day.  Duration of daily use is ongoing. Last use was <24H ago.  Pt says he has been using crack for thirty years.  There is some use of marijuana.  Patient denies any active suicidal ideations but has passive ideations I can't go on living this way.  No HI or A/V hallucinations.  Patient denies access to guns.  Patient admits to problems with depression and anxiety.  Patient denies any outpatient mental health care.  Pt dozed off towards the end of assessment.  Patient cannot stay where he had been staying.  How Long Has This Been Causing You Problems? > than 6 months  What Do You Feel Would Help You the Most Today? Treatment for  Depression or other mood problem; Alcohol or Drug Use Treatment   Have You Recently Had Any Thoughts About Hurting Yourself? No  Are You Planning to Commit Suicide/Harm Yourself At This time? No   Flowsheet Row ED from 06/28/2023 in [facility] Emergency Department at [facility] ED from 06/25/2023 in [facility] at [facility] ED from 06/22/2023 in [facility] Emergency Department at [facility]  C-SSRS RISK CATEGORY Low Risk No Risk No Risk       Have you Recently Had Thoughts About Hurting Someone Sherral? No  Are You Planning to Harm Someone at This Time? No  Explanation: Pt has passive SI.  No HI.   Have You Used Any Alcohol or Drugs in the Past 24 Hours? No data recorded What Did You Use and How Much? No data recorded  Do You Currently Have a Therapist/Psychiatrist? No data recorded Name of Therapist/Psychiatrist: Name of Therapist/Psychiatrist: Pt has no outpatient care.   Have You Been Recently Discharged From Any Office Practice or Programs? No  Explanation of Discharge From Practice/Program: No recent discharges     CCA Screening Triage Referral Assessment Type of Contact: Tele-Assessment  Telemedicine Service Delivery:   Is this Initial or Reassessment? Is this Initial or Reassessment?: Initial Assessment  Date Telepsych consult ordered in [facility]:  Date Telepsych consult ordered in [facility]: 06/28/23  Time Telepsych consult ordered in [facility]:  Time Telepsych consult ordered in [facility]: 0326  Location of  Assessment: [facility] ED  Provider Location: [facility] Assessment Services   Collateral Involvement: None available   Does Patient Have a Court Appointed Legal Guardian? No  Legal Guardian Contact Information: Pt has no legal guardian  Copy of Legal Guardianship Form: -- (Pt has no legal guardian)  Legal Guardian Notified of Arrival: -- (Pt has no legal guardian)  Legal Guardian Notified of Pending Discharge: -- (Pt has no legal  guardian)  If Minor and Not Living with Parent(s), Who has Custody? Pt is a adult.  Is CPS involved or ever been involved? Never  Is APS involved or ever been involved? Never   Patient Determined To Be At Risk for Harm To Self or Others Based on Review of Patient Reported Information or Presenting Complaint? No  Method: No Plan  Availability of Means: No access or NA  Intent: Vague intent or NA  Notification Required: No need or identified person  Additional Information for Danger to Others Potential: -- (No HI.)  Additional Comments for Danger to Others Potential: Pt denies any HI.  Are There Guns or Other Weapons in Your Home? No  Types of Guns/Weapons: No access to guns.  Are These Weapons Safely Secured?                            No  Who Could Verify You Are Able To Have These Secured: Non one else can verify whether there are any weapons to be secured.  We are at the mercy of the patient's truthfulness.  Do You Have any Outstanding Charges, Pending Court Dates, Parole/Probation? Patient denies  Contacted To Inform of Risk of Harm To Self or Others: Other: Comment (No need to contact anyone.)    Does Patient Present under Involuntary Commitment? No    [place] of Residence: [place] (Homeless in [place])   Patient Currently Receiving the Following Services: Not Receiving Services   Determination of Need: Routine (7 days)   Options For Referral: Outpatient Therapy; Chemical Dependency Intensive Outpatient Therapy (CDIOP)     CCA Biopsychosocial Patient Reported Schizophrenia/Schizoaffective Diagnosis in Past: No   Strengths: Patient is seeking assistance for his drug addiction.   Mental Health Symptoms Depression:  Increase/decrease in appetite; Difficulty Concentrating; Fatigue; Sleep (too much or little)   Duration of Depressive symptoms: Duration of Depressive Symptoms: Greater than two weeks   Mania:  None   Anxiety:   Worrying;  Sleep; Difficulty concentrating   Psychosis:  None   Duration of Psychotic symptoms:    Trauma:  Avoids reminders of event   Obsessions:  N/A   Compulsions:  N/A   Inattention:  N/A   Hyperactivity/Impulsivity:  None   Oppositional/Defiant Behaviors:  None   Emotional Irregularity:  Chronic feelings of emptiness   Other Mood/Personality Symptoms:  PTSD    Mental Status Exam Appearance and self-care  Stature:  Tall   Weight:  Thin   Clothing:  Casual   Grooming:  Neglected   Cosmetic use:  None   Posture/gait:  -- (Pt laying prone in a bed.)   Motor activity:  Not Remarkable   Sensorium  Attention:  Normal   Concentration:  Normal   Orientation:  X5   Recall/memory:  Normal   Affect and Mood  Affect:  Appropriate   Mood:  Depressed   Relating  Eye contact:  Fleeting (Pt is sleepy)   Facial expression:  -- (Sleepy)   Attitude toward examiner:  Cooperative  Thought and Language  Speech flow: Clear and Coherent   Thought content:  Appropriate to Mood and Circumstances   Preoccupation:  None   Hallucinations:  None   Organization:  Coherent; Development Worker, [employer] of Knowledge:  Average   Intelligence:  Average   Abstraction:  Normal   Judgement:  Fair   Dance Movement Psychotherapist:  Realistic   Insight:  Good   Decision Making:  Normal   Social Functioning  Social Maturity:  Responsible   Social Judgement:  Normal   Stress  Stressors:  Housing   Coping Ability:  Deficient supports   Skill Deficits:  Self-control   Supports:  Support needed     Religion: Religion/Spirituality Are You A Religious Person?:  (spiritual) How Might This Affect Treatment?: No affect on treatment  Leisure/Recreation: Leisure / Recreation Do You Have Hobbies?: No  Exercise/Diet: Exercise/Diet Do You Exercise?: No Have You Gained or Lost A Significant Amount of Weight in the Past Six Months?: No Do You Follow a Special Diet?:  No Do You Have Any Trouble Sleeping?: Yes Explanation of Sleeping Difficulties: Adequate shelter is a hinderence to healthy sleeping habits   CCA Employment/Education Employment/Work Situation: Employment / Work Situation Employment Situation: Unemployed Patient's Job has Been Impacted by Current Illness: No Has Patient ever Been in Equities Trader?: No  Education: Education Is Patient Currently Attending School?: No Last Grade Completed: 12 Did You Product Manager?: No Did You Have An Individualized Education Program (IIEP): No Did You Have Any Difficulty At [employer]?: No Patient's Education Has Been Impacted by Current Illness: No   CCA Family/Childhood History Family and Relationship History: Family history Marital status: Single Does patient have children?: Yes How many children?: 1 How is patient's relationship with their children?: Not good  Childhood History:  Childhood History By whom was/is the patient raised?:  (Pt dozed off.) Did patient suffer any verbal/emotional/physical/sexual abuse as a child?: No Did patient suffer from severe childhood neglect?: No Has patient ever been sexually abused/assaulted/raped as an adolescent or adult?: No Was the patient ever a victim of a crime or a disaster?: No Witnessed domestic violence?: No Has patient been affected by domestic violence as an adult?: No       CCA Substance Use Alcohol/Drug Use: Alcohol / Drug Use Pain Medications: SEE MAR Prescriptions: SEE MAR Over the Counter: SEE MAR History of alcohol / drug use?: Yes Longest period of sobriety (when/how long): None Negative Consequences of Use: Legal, Personal relationships Withdrawal Symptoms: None Substance #1 Name of Substance 1: Crack cocaine 1 - Age of First Use: 48 years of age 38 - Amount (size/oz): Varies as much as I can get 1 - Frequency: Daily use 1 - Duration: Pt cannot adequately quantify how long he has been using on a daily basis. 1 - Last  Use / Amount: 01/10 Amount is undetermined 1 - Method of Aquiring: illegal purchase, activities 1- Route of Use: inhalation                       ASAM's:  Six Dimensions of Multidimensional Assessment  Dimension 1:  Acute Intoxication and/or Withdrawal Potential:      Dimension 2:  Biomedical Conditions and Complications:      Dimension 3:  Emotional, Behavioral, or Cognitive Conditions and Complications:     Dimension 4:  Readiness to Change:     Dimension 5:  Relapse, Continued use, or Continued Problem Potential:  Dimension 6:  Recovery/Living Environment:     ASAM Severity Score:    ASAM Recommended Level of Treatment:     Substance use Disorder (SUD)    Recommendations for Services/Supports/Treatments:    Disposition Recommendation per psychiatric provider: There are no psychiatric contraindications to discharge at this time   DSM5 Diagnoses: There are no active problems to display for this patient.    Referrals to Alternative Service(s): Referred to Alternative Service(s):   Place:   Date:   Time:    Referred to Alternative Service(s):   Place:   Date:   Time:    Referred to Alternative Service(s):   Place:   Date:   Time:    Referred to Alternative Service(s):   Place:   Date:   Time:     Mitchell Jerona Levander HENRI

## 2023-06-28 NOTE — ED Triage Notes (Signed)
 The pt reports that he has been depressed  and he feels like he needs to speak to someone about this  he denies si or hi  he has not been on psy meds and he has not been in any facility for the same

## 2023-07-09 ENCOUNTER — Encounter (HOSPITAL_COMMUNITY): Payer: Self-pay | Admitting: Emergency Medicine

## 2023-07-09 ENCOUNTER — Emergency Department (HOSPITAL_COMMUNITY)
Admission: EM | Admit: 2023-07-09 | Discharge: 2023-07-09 | Disposition: A | Discharge status: Home / Self Care | Payer: Medicaid Other | Attending: Emergency Medicine | Admitting: Emergency Medicine

## 2023-07-09 ENCOUNTER — Emergency Department (HOSPITAL_COMMUNITY): Payer: Medicaid Other

## 2023-07-09 ENCOUNTER — Other Ambulatory Visit: Payer: Self-pay

## 2023-07-09 DIAGNOSIS — D72829 Elevated white blood cell count, unspecified: Secondary | ICD-10-CM | POA: Insufficient documentation

## 2023-07-09 DIAGNOSIS — Z4800 Encounter for change or removal of nonsurgical wound dressing: Secondary | ICD-10-CM | POA: Diagnosis not present

## 2023-07-09 DIAGNOSIS — Z4801 Encounter for change or removal of surgical wound dressing: Secondary | ICD-10-CM | POA: Diagnosis not present

## 2023-07-09 DIAGNOSIS — M7989 Other specified soft tissue disorders: Secondary | ICD-10-CM | POA: Diagnosis not present

## 2023-07-09 DIAGNOSIS — Z5189 Encounter for other specified aftercare: Secondary | ICD-10-CM

## 2023-07-09 LAB — CBC WITH DIFFERENTIAL/PLATELET
Abs Immature Granulocytes: 0.04 10*3/uL (ref 0.00–0.07)
Basophils Absolute: 0.1 10*3/uL (ref 0.0–0.1)
Basophils Relative: 1 %
Eosinophils Absolute: 0.1 10*3/uL (ref 0.0–0.5)
Eosinophils Relative: 0 %
HCT: 39.4 % (ref 39.0–52.0)
Hemoglobin: 13.2 g/dL (ref 13.0–17.0)
Immature Granulocytes: 0 %
Lymphocytes Relative: 20 %
Lymphs Abs: 2.3 10*3/uL (ref 0.7–4.0)
MCH: 31.4 pg (ref 26.0–34.0)
MCHC: 33.5 g/dL (ref 30.0–36.0)
MCV: 93.6 fL (ref 80.0–100.0)
Monocytes Absolute: 0.8 10*3/uL (ref 0.1–1.0)
Monocytes Relative: 7 %
Neutro Abs: 8.2 10*3/uL — ABNORMAL HIGH (ref 1.7–7.7)
Neutrophils Relative %: 72 %
Platelets: 467 10*3/uL — ABNORMAL HIGH (ref 150–400)
RBC: 4.21 MIL/uL — ABNORMAL LOW (ref 4.22–5.81)
RDW: 14 % (ref 11.5–15.5)
WBC: 11.3 10*3/uL — ABNORMAL HIGH (ref 4.0–10.5)
nRBC: 0 % (ref 0.0–0.2)

## 2023-07-09 LAB — COMPREHENSIVE METABOLIC PANEL
ALT: 21 U/L (ref 0–44)
AST: 20 U/L (ref 15–41)
Albumin: 3.6 g/dL (ref 3.5–5.0)
Alkaline Phosphatase: 52 U/L (ref 38–126)
Anion gap: 12 (ref 5–15)
BUN: 14 mg/dL (ref 6–20)
CO2: 20 mmol/L — ABNORMAL LOW (ref 22–32)
Calcium: 8.8 mg/dL — ABNORMAL LOW (ref 8.9–10.3)
Chloride: 102 mmol/L (ref 98–111)
Creatinine, Ser: 0.9 mg/dL (ref 0.61–1.24)
GFR, Estimated: >60 mL/min (ref >60)
Glucose, Bld: 109 mg/dL — ABNORMAL HIGH (ref 70–99)
Potassium: 3.7 mmol/L (ref 3.5–5.1)
Sodium: 134 mmol/L — ABNORMAL LOW (ref 135–145)
Total Bilirubin: 0.6 mg/dL (ref 0.0–1.2)
Total Protein: 7 g/dL (ref 6.5–8.1)

## 2023-07-09 LAB — I-STAT CG4 LACTIC ACID, ED: Lactic Acid, Venous: 0.7 mmol/L (ref 0.5–1.9)

## 2023-07-09 MED ORDER — CEPHALEXIN 500 MG PO CAPS: 500.0000 mg | ORAL_CAPSULE | Freq: Four times a day (QID) | ORAL | 0 refills | Status: DC

## 2023-07-09 NOTE — ED Triage Notes (Signed)
Pt here for a wound to his L foot near pinky toe states it has been there for a couple of months. Reports drainage from the wound. No drainage at this time, appears scabbed over and redness noted.

## 2023-07-09 NOTE — Congregational Nurse Program (Unsigned)
  Dept: [phone]   Congregational Nurse Program Note  Date of Encounter: 07/09/2023  Past Medical History: Past Medical History:  Diagnosis Date   Peptic ulcer disease    PTSD (post-traumatic stress disorder)     Encounter Details:  Community Questionnaire - 07/09/23 1042       Questionnaire   Ask client: Do you give verbal consent for me to treat you today? Yes    Student Assistance [employer]    Location Patient Served  [facility]    Encounter Setting CN site    Population Status Unhoused    Insurance Medicaid    Insurance/Financial Assistance Referral N/A    Medication Have Medication Insecurities;Patient Medications Reviewed    Medical Provider No    Screening Referrals Made N/A    Medical Referrals Made 1st time PCP connection;[facility] PCP/Clinic    Medical Appointment Completed N/A    CNP Interventions Advocate/Support;Navigate Healthcare System;Case Management;Counsel;Educate;Spiritual Care    Screenings CN Performed N/A    ED Visit Averted Yes    Life-Saving Intervention Made N/A            Client discharged from hospital this morning. He was referred to my by [employer]. States he has foot wound and didn't understand what was wrong with his foot and needs help. RN helped client understand lab values and images taken by ED. He is also in need of antibiotics and he has lost his printed prescription. RN messaged DO who provided care to refill rx at [facility] on [address]. Awaiting response. Client also in need of a PCP to follow up with care. Appointment made at [facility]. 1/29 at 11:20 to be seen by a provider. Client states he does not need any wound care supplies at this time.

## 2023-07-09 NOTE — Discharge Instructions (Addendum)
Take the prescribed medication as directed.  Daily wound care to the area recommended. Follow-up with the wellness clinic-- this is our FREE clinic.  Call to make appt. Return to the ED for new or worsening symptoms.

## 2023-07-09 NOTE — ED Provider Notes (Signed)
[place] EMERGENCY DEPARTMENT AT [facility] Provider Note   CSN: [id number] Arrival date & time: 07/09/23  0222     History  Chief Complaint  Patient presents with   Wound Check    Juan Sawyer is a 48 y.o. male.  The history is provided by the patient and medical records.  Wound Check   48 year old male presenting to the ED with wound care left fifth toe.  States that has been there for quite some time.  He works doing Aeronautical engineer.  Today after returning home from work he took off his sock and shoe and realized that there was some blood and pus in his sock.  States wound tends to do this from time to time.  He was seen here before but left prior to results given to him.  He denies any fever or chills.  He is not diabetic.  States feels like what it is has gone down to "the bone".    Home Medications Prior to Admission medications   Medication Sig Start Date End Date Taking? Authorizing Provider  acetaminophen (TYLENOL EX ST ARTHRITIS PAIN) 500 MG tablet Take 1,000 mg by mouth every 4 (four) hours as needed for mild pain.    [provider]      Allergies    Naprosyn [naproxen] and Nsaids    Review of Systems   Review of Systems  Musculoskeletal:  Positive for arthralgias.  All other systems reviewed and are negative.   Physical Exam Updated Vital Signs BP 119/81 (BP Location: Right Arm)   Pulse 91   Temp 98.4 F (36.9 C) (Oral)   Resp 20   Ht 5\' 10"  (1.778 m)   Wt 72.6 kg   SpO2 98%   BMI 22.96 kg/m   Physical Exam Vitals and nursing note reviewed.  Constitutional:      Appearance: He is well-developed.  HENT:     Head: Normocephalic and atraumatic.  Eyes:     Conjunctiva/sclera: Conjunctivae normal.     Pupils: Pupils are equal, round, and reactive to light.  Cardiovascular:     Rate and Rhythm: Normal rate and regular rhythm.     Heart sounds: Normal heart sounds.  Pulmonary:     Effort: Pulmonary effort is normal.     Breath  sounds: Normal breath sounds.  Abdominal:     General: Bowel sounds are normal.     Palpations: Abdomen is soft.  Musculoskeletal:        General: Normal range of motion.     Cervical back: Normal range of motion.     Comments: Old appearing scab to dorsal left foot along 4th/5th toes; there is no active drainage or bleeding; foot does not appear cellulitic, no warmth to touch  Skin:    General: Skin is warm and dry.  Neurological:     Mental Status: He is alert and oriented to person, place, and time.     ED Results / Procedures / Treatments   Labs (all labs ordered are listed, but only abnormal results are displayed) Labs Reviewed  COMPREHENSIVE METABOLIC PANEL - Abnormal; Notable for the following components:      Result Value   Sodium 134 (*)    CO2 20 (*)    Glucose, Bld 109 (*)    Calcium 8.8 (*)    All other components within normal limits  CBC WITH DIFFERENTIAL/PLATELET - Abnormal; Notable for the following components:   WBC 11.3 (*)    RBC  4.21 (*)    Platelets 467 (*)    Neutro Abs 8.2 (*)    All other components within normal limits  I-STAT CG4 LACTIC ACID, ED    EKG None  Radiology DG Toe 5th Left Result Date: 07/09/2023 CLINICAL DATA:  48 year old male with unhealing left 5th toe wound. Drainage, redness. EXAM: DG TOE 5TH LEFT COMPARISON:  Left foot series 06/22/2023. FINDINGS: Smoothly corticated abnormal bone deformity of the 5th metatarsal and 5th phalanges as described in January. Associated foreshortening of the toe. Those osseous structures appear stable with no convincing cortical osteolysis. Regional 5th toe soft tissue swelling. No soft tissue gas. Other visible left foot osseous structures appear intact and normally formed. IMPRESSION: Left 5th toe soft tissue swelling with no plain radiographic evidence of osteomyelitis. 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 Electronically Signed   By: Odessa Fleming M.D.    On: 07/09/2023 05:35    Procedures Procedures    Medications Ordered in ED Medications - No data to display  ED Course/ Medical Decision Making/ A&P                                 Medical Decision Making Amount and/or Complexity of Data Reviewed Labs: ordered. Radiology: ordered and independent interpretation performed. ECG/medicine tests: ordered and independent interpretation performed.  Risk Prescription drug management.   48 y.o. M here with wound to left foot near 4th/5th toes.  This has been chronic for quite some time.  He reports has had some drainage and bleeding from area today.  Does have scabbed over wound near the interspace of the fourth and fifth toes.  There is no active bleeding or drainage currently.  Does not have any significant erythema or induration.  Does have mild leukocytosis but no electrolyte derangement.  X-ray without soft tissue gas or findings of osteomyelitis. 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 He is not diabetic.  Given reported purulent drainage, will give course of Keflex.  Home wound care recommended.  Outpatient follow-up given.  Return here for new concerns.  Final Clinical Impression(s) / ED Diagnoses Final diagnoses:  Visit for wound check    Rx / DC Orders ED Discharge Orders          Ordered    cephALEXin (KEFLEX) 500 MG capsule  4 times daily        07/09/23 0607              Garlon Hatchet, PA-C 07/09/23 0612    Melene Plan, DO 07/09/23 [phone]

## 2023-07-16 ENCOUNTER — Ambulatory Visit: Payer: Self-pay | Admitting: Nurse Practitioner

## 2023-07-23 ENCOUNTER — Ambulatory Visit: Payer: Self-pay | Admitting: Nurse Practitioner

## 2023-07-27 ENCOUNTER — Other Ambulatory Visit: Payer: Self-pay

## 2023-07-27 ENCOUNTER — Emergency Department (HOSPITAL_COMMUNITY)
Admission: EM | Admit: 2023-07-27 | Discharge: 2023-07-27 | Disposition: A | Discharge status: Home / Self Care | Payer: Medicaid Other | Attending: Emergency Medicine | Admitting: Emergency Medicine

## 2023-07-27 ENCOUNTER — Emergency Department (HOSPITAL_COMMUNITY): Payer: Medicaid Other

## 2023-07-27 ENCOUNTER — Encounter (HOSPITAL_COMMUNITY): Payer: Self-pay

## 2023-07-27 DIAGNOSIS — M778 Other enthesopathies, not elsewhere classified: Secondary | ICD-10-CM | POA: Diagnosis not present

## 2023-07-27 DIAGNOSIS — S40011A Contusion of right shoulder, initial encounter: Secondary | ICD-10-CM

## 2023-07-27 DIAGNOSIS — M70811 Other soft tissue disorders related to use, overuse and pressure, right shoulder: Secondary | ICD-10-CM | POA: Diagnosis not present

## 2023-07-27 DIAGNOSIS — W1839XA Other fall on same level, initial encounter: Secondary | ICD-10-CM | POA: Diagnosis not present

## 2023-07-27 DIAGNOSIS — M19011 Primary osteoarthritis, right shoulder: Secondary | ICD-10-CM | POA: Diagnosis not present

## 2023-07-27 DIAGNOSIS — Y9372 Activity, wrestling: Secondary | ICD-10-CM | POA: Diagnosis not present

## 2023-07-27 DIAGNOSIS — M25511 Pain in right shoulder: Secondary | ICD-10-CM | POA: Diagnosis not present

## 2023-07-27 DIAGNOSIS — S4991XA Unspecified injury of right shoulder and upper arm, initial encounter: Secondary | ICD-10-CM | POA: Diagnosis present

## 2023-07-27 MED ORDER — HYDROCODONE-ACETAMINOPHEN 5-325 MG PO TABS: 1.0000 | ORAL_TABLET | ORAL | 0 refills | Status: DC | PRN

## 2023-07-27 MED ORDER — HYDROCODONE-ACETAMINOPHEN 5-325 MG PO TABS
1.0000 | ORAL_TABLET | Freq: Once | ORAL | Status: AC
  Administered 2023-07-27: 1 via ORAL
  Filled 2023-07-27: qty 1

## 2023-07-27 NOTE — ED Provider Notes (Signed)
 [place] EMERGENCY DEPARTMENT AT [facility] Provider Note   CSN: [id number] Arrival date & time: 07/27/23  9188     History  Chief Complaint  Patient presents with   Shoulder Injury    Juan Sawyer is a 48 y.o. male.  Patient to ED with right shoulder pain since wrestling last night, falling with impact to the shoulder. No other injury. He denies hitting his head. Not anticoagulated.   The history is provided by the patient. No language interpreter was used.  Shoulder Injury       Home Medications Prior to Admission medications   Medication Sig Start Date End Date Taking? Authorizing Provider  HYDROcodone -acetaminophen  (NORCO/VICODIN) 5-325 MG tablet Take 1-2 tablets by mouth every 4 (four) hours as needed. 07/27/23  Yes Blayke Cordrey, PA-C  acetaminophen  (TYLENOL  EX ST ARTHRITIS PAIN) 500 MG tablet Take 1,000 mg by mouth every 4 (four) hours as needed for mild pain.    [provider]  cephALEXin  (KEFLEX ) 500 MG capsule Take 1 capsule (500 mg total) by mouth 4 (four) times daily. 07/09/23   Jarold Olam HERO, PA-C      Allergies    Naprosyn  [naproxen ] and Nsaids    Review of Systems   Review of Systems  Physical Exam Updated Vital Signs BP 118/78   Pulse 68   Temp 98.1 F (36.7 C)   Resp 19   Ht 5' 10 (1.778 m)   Wt 72.6 kg   SpO2 97%   BMI 22.96 kg/m  Physical Exam Vitals and nursing note reviewed.  Constitutional:      Appearance: Normal appearance.  HENT:     Head: Atraumatic.  Neck:     Comments: No midline cervical or paracervical tenderness.  Cardiovascular:     Rate and Rhythm: Normal rate.  Pulmonary:     Effort: Pulmonary effort is normal.  Musculoskeletal:     Cervical back: Normal range of motion and neck supple.     Comments: Right shoulder is mildly swollen over deltoid. Tender. No bony deformity. FROM distal arm with normal grip strength. Pain with even passive abduction of the shoulder. No bruising.   Skin:     Findings: No bruising, erythema or lesion.  Neurological:     Mental Status: He is alert.     Sensory: No sensory deficit.     ED Results / Procedures / Treatments   Labs (all labs ordered are listed, but only abnormal results are displayed) Labs Reviewed - No data to display  EKG None  Radiology DG Shoulder Right Result Date: 07/27/2023 CLINICAL DATA:  Fall last night.  Right shoulder pain and swelling. EXAM: RIGHT SHOULDER - 2+ VIEW COMPARISON:  None Available. FINDINGS: There is no evidence of fracture or dislocation. Mild degenerative spurring of the acromion process noted, with small geode and sclerosis along the greater tuberosity of the humeral head. No other osseous abnormality identified. IMPRESSION: No acute findings. Mild degenerative changes, as described above. Electronically Signed   By: Norleen DELENA Kil M.D.   On: 07/27/2023 09:36    Procedures Procedures    Medications Ordered in ED Medications  HYDROcodone -acetaminophen  (NORCO/VICODIN) 5-325 MG per tablet 1 tablet (1 tablet Oral Given 07/27/23 1013)    ED Course/ Medical Decision Making/ A&P Clinical Course as of 07/27/23 1029  Sun Jul 27, 2023  1023 Fell onto right shoulder last night while play fighting with a friend. C/O pain at lateral shoulder, hurts to move.  [SU]  1025 Imaging  is negative for fracture or dislocation. Sling provided. Pain management provided. Ortho referral prn if pain is no better in 3-4 days.  [SU]    Clinical Course User Index [SU] Odell Balls, PA-C                                 Medical Decision Making Amount and/or Complexity of Data Reviewed Radiology: ordered.  Risk Prescription drug management.           Final Clinical Impression(s) / ED Diagnoses Final diagnoses:  Contusion of right shoulder, initial encounter    Rx / DC Orders ED Discharge Orders          Ordered    HYDROcodone -acetaminophen  (NORCO/VICODIN) 5-325 MG tablet  Every 4 hours PRN         07/27/23 1029              Odell Balls, PA-C 07/27/23 1029    Bernard Drivers, MD 07/28/23 [phone]

## 2023-07-27 NOTE — ED Notes (Signed)
Sling applied by Ortho

## 2023-07-27 NOTE — ED Triage Notes (Signed)
 Patient was scuffling with a buddy last night and fell on right shoulder.  Shoulder is swollen and patient has very  limited movement.  Unsure if broke or dislocation.  Patient has +radial pulse but unable to lift arm.

## 2023-07-27 NOTE — Discharge Instructions (Signed)
 Wear the sling when active and remove it when not active. Ice to reduce any swelling. Take Norco for pain as recommended.   If pain does not improve in 3-4 days, call Dr. Jackee Marus to schedule an appointment for re-examination.

## 2023-07-27 NOTE — Progress Notes (Signed)
 Orthopedic Tech Progress Note Patient Details:  Juan Sawyer 1976/02/25 [id number]  Ortho Devices Type of Ortho Device: Shoulder immobilizer Ortho Device/Splint Location: RUE Ortho Device/Splint Interventions: Ordered, Application, Adjustment   Post Interventions Patient Tolerated: Well Instructions Provided: Care of device, Adjustment of device  [name] A Daiwik Buffalo 07/27/2023, 10:51 AM

## 2023-08-06 ENCOUNTER — Other Ambulatory Visit: Payer: Self-pay

## 2023-08-06 ENCOUNTER — Emergency Department (HOSPITAL_COMMUNITY): Payer: Medicaid Other

## 2023-08-06 ENCOUNTER — Emergency Department (HOSPITAL_COMMUNITY)
Admission: EM | Admit: 2023-08-06 | Discharge: 2023-08-06 | Discharge status: Against Medical Advice | Payer: Medicaid Other | Attending: Emergency Medicine | Admitting: Emergency Medicine

## 2023-08-06 DIAGNOSIS — Z5321 Procedure and treatment not carried out due to patient leaving prior to being seen by health care provider: Secondary | ICD-10-CM | POA: Diagnosis not present

## 2023-08-06 DIAGNOSIS — M25511 Pain in right shoulder: Secondary | ICD-10-CM | POA: Insufficient documentation

## 2023-08-06 NOTE — ED Triage Notes (Signed)
 Patient reports persistent right shoulder joint pain injured last 07/27/23 , seen here x-ray done and Rx given with no relief. Denies recent injury.

## 2023-08-06 NOTE — ED Notes (Signed)
 Unable to locate patient at waiting area  by radiology tech.

## 2023-08-06 NOTE — ED Notes (Signed)
 Pt called x3 for xray of shoulder, no answer.  KM

## 2023-08-12 ENCOUNTER — Encounter (HOSPITAL_COMMUNITY): Payer: Self-pay

## 2023-08-12 ENCOUNTER — Other Ambulatory Visit: Payer: Self-pay

## 2023-08-12 ENCOUNTER — Ambulatory Visit (HOSPITAL_COMMUNITY)
Admission: EM | Admit: 2023-08-12 | Discharge: 2023-08-12 | Discharge status: Against Medical Advice | Payer: Medicaid Other

## 2023-08-12 ENCOUNTER — Emergency Department (HOSPITAL_COMMUNITY)
Admission: EM | Admit: 2023-08-12 | Discharge: 2023-08-13 | Disposition: A | Discharge status: Home / Self Care | Payer: Medicaid Other | Attending: Emergency Medicine | Admitting: Emergency Medicine

## 2023-08-12 DIAGNOSIS — F1220 Cannabis dependence, uncomplicated: Secondary | ICD-10-CM | POA: Diagnosis not present

## 2023-08-12 DIAGNOSIS — F32A Depression, unspecified: Secondary | ICD-10-CM | POA: Diagnosis not present

## 2023-08-12 DIAGNOSIS — F4310 Post-traumatic stress disorder, unspecified: Secondary | ICD-10-CM | POA: Diagnosis not present

## 2023-08-12 DIAGNOSIS — R45851 Suicidal ideations: Secondary | ICD-10-CM | POA: Insufficient documentation

## 2023-08-12 DIAGNOSIS — F1420 Cocaine dependence, uncomplicated: Secondary | ICD-10-CM | POA: Diagnosis present

## 2023-08-12 DIAGNOSIS — Z765 Malingerer [conscious simulation]: Secondary | ICD-10-CM

## 2023-08-12 DIAGNOSIS — F1910 Other psychoactive substance abuse, uncomplicated: Secondary | ICD-10-CM | POA: Diagnosis present

## 2023-08-12 DIAGNOSIS — Z5900 Homelessness unspecified: Secondary | ICD-10-CM | POA: Diagnosis not present
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
## 2023-08-12 NOTE — ED Triage Notes (Signed)
 Pt was at [facility] for eval today in which he was not seen. He has been going through a lot of life stressors recents as well as losing a close companion on his and it has been really tough for him. He is very tearful in triage and is seeking help with what he is feeling. He has not attempted to hurt himself today, and has not taken any medications in an attempt to hurt himself but he does feel like he is at the end of his rope. He has no other complaints at this time.

## 2023-08-12 NOTE — Congregational Nurse Program (Signed)
  Dept: [phone]   Congregational Nurse Program Note  Date of Encounter: 08/12/2023  Past Medical History: Past Medical History:  Diagnosis Date   Peptic ulcer disease    PTSD (post-traumatic stress disorder)     Encounter Details:  Community Questionnaire - 08/12/23 1505       Questionnaire   Ask client: Do you give verbal consent for me to treat you today? Yes    Student Assistance N/A    Location Patient Served  [facility]    Encounter Setting CN site    Population Status Unhoused    Insurance Medicaid    Insurance/Financial Assistance Referral N/A    Medication Have Medication Insecurities;Patient Medications Reviewed    Medical Provider No    Screening Referrals Made N/A    Medical Referrals Made [facility];Drug/Alcohol Services    Medical Appointment Completed N/A    CNP Interventions Advocate/Support;Navigate Healthcare System;Case Management;Counsel;Educate;Spiritual Care    Screenings CN Performed N/A    ED Visit Averted N/A    Life-Saving Intervention Made Yes            Client to RN office he states he has been struggling with Cocaine addiction and is need of help and inpatient treatment. RN assisted client with Safe Transport to [facility] for detox services and inpatient rehab.RN will continue to follow client to support.

## 2023-08-13 ENCOUNTER — Ambulatory Visit (HOSPITAL_COMMUNITY)
Admission: EM | Admit: 2023-08-13 | Discharge: 2023-08-13 | Discharge status: Against Medical Advice | Payer: Medicaid Other

## 2023-08-13 ENCOUNTER — Encounter (HOSPITAL_COMMUNITY): Payer: Self-pay | Admitting: Psychiatry

## 2023-08-13 DIAGNOSIS — Z5900 Homelessness unspecified: Secondary | ICD-10-CM

## 2023-08-13 DIAGNOSIS — F1910 Other psychoactive substance abuse, uncomplicated: Secondary | ICD-10-CM | POA: Diagnosis present

## 2023-08-13 DIAGNOSIS — F4310 Post-traumatic stress disorder, unspecified: Secondary | ICD-10-CM | POA: Diagnosis present

## 2023-08-13 DIAGNOSIS — F1220 Cannabis dependence, uncomplicated: Secondary | ICD-10-CM

## 2023-08-13 DIAGNOSIS — Z765 Malingerer [conscious simulation]: Secondary | ICD-10-CM

## 2023-08-13 DIAGNOSIS — F1420 Cocaine dependence, uncomplicated: Secondary | ICD-10-CM

## 2023-08-13 LAB — COMPREHENSIVE METABOLIC PANEL
ALT: 16 U/L (ref 0–44)
AST: 21 U/L (ref 15–41)
Albumin: 3.6 g/dL (ref 3.5–5.0)
Alkaline Phosphatase: 61 U/L (ref 38–126)
Anion gap: 11 (ref 5–15)
BUN: 13 mg/dL (ref 6–20)
CO2: 24 mmol/L (ref 22–32)
Calcium: 8.9 mg/dL (ref 8.9–10.3)
Chloride: 103 mmol/L (ref 98–111)
Creatinine, Ser: 1 mg/dL (ref 0.61–1.24)
GFR, Estimated: >60 mL/min (ref >60)
Glucose, Bld: 131 mg/dL — ABNORMAL HIGH (ref 70–99)
Potassium: 3.9 mmol/L (ref 3.5–5.1)
Sodium: 138 mmol/L (ref 135–145)
Total Bilirubin: 0.4 mg/dL (ref 0.0–1.2)
Total Protein: 7.4 g/dL (ref 6.5–8.1)

## 2023-08-13 LAB — CBC
HCT: 41.5 % (ref 39.0–52.0)
Hemoglobin: 14.1 g/dL (ref 13.0–17.0)
MCH: 31.8 pg (ref 26.0–34.0)
MCHC: 34 g/dL (ref 30.0–36.0)
MCV: 93.5 fL (ref 80.0–100.0)
Platelets: 452 10*3/uL — ABNORMAL HIGH (ref 150–400)
RBC: 4.44 MIL/uL (ref 4.22–5.81)
RDW: 13.7 % (ref 11.5–15.5)
WBC: 10.4 10*3/uL (ref 4.0–10.5)
nRBC: 0 % (ref 0.0–0.2)

## 2023-08-13 LAB — RAPID URINE DRUG SCREEN, HOSP PERFORMED
Amphetamines: NOT DETECTED
Barbiturates: NOT DETECTED
Benzodiazepines: NOT DETECTED
Cocaine: POSITIVE — AB
Opiates: NOT DETECTED
Tetrahydrocannabinol: POSITIVE — AB

## 2023-08-13 LAB — ACETAMINOPHEN LEVEL: Acetaminophen (Tylenol), Serum: <10 ug/mL — ABNORMAL LOW (ref 10–30)

## 2023-08-13 LAB — SALICYLATE LEVEL: Salicylate Lvl: <7 mg/dL — ABNORMAL LOW (ref 7.0–30.0)

## 2023-08-13 LAB — ETHANOL: Alcohol, Ethyl (B): <10 mg/dL (ref <10)
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
## 2023-08-13 NOTE — ED Provider Notes (Addendum)
 Emergency Medicine Observation Re-evaluation Note  Juan Sawyer is a 48 y.o. male, seen on rounds today.  Pt initially presented to the ED for complaints of Suicidal Currently, the patient is asleep.  Pt came in early this am for SI.  TTS has been consulted, but has not seen him yet.  Physical Exam  BP (!) 123/92 (BP Location: Right Arm)   Pulse 85   Temp 98.3 F (36.8 C)   Resp (!) 22   SpO2 100%  Physical Exam General: asleep Cardiac: rr Lungs: clear Psych: asleep  ED Course / MDM  EKG:   I have reviewed the labs performed to date as well as medications administered while in observation.  Recent changes in the last 24 hours include ED eval.  Plan  Current plan is for TTS eval.    Jacalyn Lefevre, MD 08/13/23 [phone]  TTS eval complete.  Pt to be d/c'd home.    Jacalyn Lefevre, MD 08/13/23 [phone]

## 2023-08-13 NOTE — ED Provider Notes (Signed)
 Evaluated this patient in our purple pod.  Patient initially came to the emergency department for multiple complaints, reported some vague suicidality and so began a psych evaluation.  Was asked by nursing staff to come to evaluate the patient because he was saying that he did not want to be here after being evaluated by our psychiatry NP.  I went to assess the patient, he was a bit frustrated.  Patient says that he was annoyed because he felt as though he had never said that he was suicidal.  He described "life is like a big cup of water, and each drop contributes to it.  When you get to many drops in the cup and it starts to overflow, then you can feel like wanting to hurt yourself.  I never said I wanted to hurt myself.  I do not want to hurt myself."  Patient told me that he was looking for detox.  At this time patient is not endorsing any SI.  He is of sound mind.  He overall looks well.  I do not believe patient requires involuntary commitment.   Anders Simmonds T, DO 08/13/23 1038

## 2023-08-13 NOTE — ED Notes (Signed)
 Per triage, Patient left AMA.

## 2023-08-13 NOTE — ED Provider Notes (Signed)
 [place] EMERGENCY DEPARTMENT AT [facility] Provider Note   CSN: [id number] Arrival date & time: 08/12/23  2334     History  Chief Complaint  Patient presents with   Suicidal    Juan Sawyer is a 48 y.o. male.  The history is provided by the patient and medical records.   48 y.o. M with history of PUD, PTSD, presenting to the ED for suicidal ideation.  He initially presented to [facility] but was not seen after several hours so he left and came here.  He reports life has been a lot for him lately with multiple stressors, notably losing a very close friend of his that he was with daily.  States he feels like everything is just stacking up 1 on top of the other and he is at the end of his rope.  He denies any suicidal plan but also does not feel safe being on his own.  He denies any HI/AVH.  Did previously take medications for depression but has not been on them in several years.  States while taking them he felt like a "zombie", almost like he had no emotions.  States he does not want to feel that way again but knows something is not right and he needs help.  He does not have family locally, no real support system around now that his friend passed.  Home Medications Prior to Admission medications   Medication Sig Start Date End Date Taking? Authorizing Provider  acetaminophen (TYLENOL EX ST ARTHRITIS PAIN) 500 MG tablet Take 1,000 mg by mouth every 4 (four) hours as needed for mild pain.    [provider]  cephALEXin (KEFLEX) 500 MG capsule Take 1 capsule (500 mg total) by mouth 4 (four) times daily. 07/09/23   Garlon Hatchet, PA-C  HYDROcodone-acetaminophen (NORCO/VICODIN) 5-325 MG tablet Take 1-2 tablets by mouth every 4 (four) hours as needed. 07/27/23   Elpidio Anis, PA-C      Allergies    Naprosyn [naproxen] and Nsaids    Review of Systems   Review of Systems  Psychiatric/Behavioral:  Positive for suicidal ideas.   All other systems reviewed and are  negative.   Physical Exam Updated Vital Signs BP (!) 123/92 (BP Location: Right Arm)   Pulse 85   Temp 98.3 F (36.8 C)   Resp (!) 22   SpO2 100%  Physical Exam Vitals and nursing note reviewed.  Constitutional:      Appearance: He is well-developed.  HENT:     Head: Normocephalic and atraumatic.  Eyes:     Conjunctiva/sclera: Conjunctivae normal.     Pupils: Pupils are equal, round, and reactive to light.  Cardiovascular:     Rate and Rhythm: Normal rate and regular rhythm.     Heart sounds: Normal heart sounds.  Pulmonary:     Effort: Pulmonary effort is normal.     Breath sounds: Normal breath sounds.  Abdominal:     General: Bowel sounds are normal.     Palpations: Abdomen is soft.  Musculoskeletal:        General: Normal range of motion.     Cervical back: Normal range of motion.  Skin:    General: Skin is warm and dry.  Neurological:     Mental Status: He is alert and oriented to person, place, and time.  Psychiatric:     Comments: Depressed, tearful during conversation SI without plan, denies HI/AVH     ED Results / Procedures / Treatments  Labs (all labs ordered are listed, but only abnormal results are displayed) Labs Reviewed  COMPREHENSIVE METABOLIC PANEL - Abnormal; Notable for the following components:      Result Value   Glucose, Bld 131 (*)    All other components within normal limits  SALICYLATE LEVEL - Abnormal; Notable for the following components:   Salicylate Lvl <7.0 (*)    All other components within normal limits  ACETAMINOPHEN LEVEL - Abnormal; Notable for the following components:   Acetaminophen (Tylenol), Serum <10 (*)    All other components within normal limits  CBC - Abnormal; Notable for the following components:   Platelets 452 (*)    All other components within normal limits  ETHANOL  RAPID URINE DRUG SCREEN, HOSP PERFORMED    EKG None  Radiology No results found.  Procedures Procedures    Medications Ordered  in ED Medications - No data to display  ED Course/ Medical Decision Making/ A&P                                 Medical Decision Making Amount and/or Complexity of Data Reviewed Labs: ordered. ECG/medicine tests: ordered and independent interpretation performed.   48 year old male presenting to the ED with suicidal ideation.  Went to [facility] initially but left after waiting for several hours without being seen.  No specific plan.  He does seem depressed and tearful when talking with him.  Denies HI/AVH.  Screening labs were obtained and are grossly reassuring without leukocytosis or electrolyte derangement.  Negative ethanol, Tylenol, and salicylate levels.  UDS + for cocaine and THC.  Medically cleared.  Will get TTS evaluation.  6:23 AM TTS not done overnight.  Day team to follow-up on recommendations.  Final Clinical Impression(s) / ED Diagnoses Final diagnoses:  Suicidal ideation    Rx / DC Orders ED Discharge Orders     None         Garlon Hatchet, PA-C 08/13/23 4098    Tilden Fossa, MD 08/13/23 [phone]

## 2023-08-13 NOTE — BH Assessment (Signed)
 TTS Consult to be completed by IRIS. IRIS Coordinator will communicate in established secure chat assessment time and provider. Thanks

## 2023-08-16 ENCOUNTER — Other Ambulatory Visit (HOSPITAL_COMMUNITY)
Admission: EM | Admit: 2023-08-16 | Discharge: 2023-08-18 | Disposition: A | Discharge status: Home / Self Care | Attending: Psychiatry | Admitting: Psychiatry

## 2023-08-16 ENCOUNTER — Encounter (HOSPITAL_COMMUNITY): Payer: Self-pay | Admitting: *Deleted

## 2023-08-16 ENCOUNTER — Emergency Department (HOSPITAL_COMMUNITY)
Admission: EM | Admit: 2023-08-16 | Discharge: 2023-08-16 | Disposition: A | Discharge status: Other Acute Inpatient Hospital | Attending: Emergency Medicine | Admitting: Emergency Medicine

## 2023-08-16 ENCOUNTER — Other Ambulatory Visit: Payer: Self-pay

## 2023-08-16 DIAGNOSIS — F1914 Other psychoactive substance abuse with psychoactive substance-induced mood disorder: Secondary | ICD-10-CM | POA: Diagnosis present

## 2023-08-16 DIAGNOSIS — F1994 Other psychoactive substance use, unspecified with psychoactive substance-induced mood disorder: Secondary | ICD-10-CM | POA: Diagnosis not present

## 2023-08-16 DIAGNOSIS — R45851 Suicidal ideations: Secondary | ICD-10-CM

## 2023-08-16 DIAGNOSIS — F4310 Post-traumatic stress disorder, unspecified: Secondary | ICD-10-CM | POA: Insufficient documentation

## 2023-08-16 DIAGNOSIS — F1290 Cannabis use, unspecified, uncomplicated: Secondary | ICD-10-CM

## 2023-08-16 DIAGNOSIS — F32A Depression, unspecified: Secondary | ICD-10-CM | POA: Diagnosis not present

## 2023-08-16 DIAGNOSIS — F1410 Cocaine abuse, uncomplicated: Secondary | ICD-10-CM | POA: Insufficient documentation

## 2023-08-16 DIAGNOSIS — F14220 Cocaine dependence with intoxication, uncomplicated: Secondary | ICD-10-CM

## 2023-08-16 DIAGNOSIS — Z9151 Personal history of suicidal behavior: Secondary | ICD-10-CM | POA: Insufficient documentation

## 2023-08-16 DIAGNOSIS — Z62810 Personal history of physical and sexual abuse in childhood: Secondary | ICD-10-CM | POA: Insufficient documentation

## 2023-08-16 DIAGNOSIS — F1910 Other psychoactive substance abuse, uncomplicated: Secondary | ICD-10-CM | POA: Diagnosis present

## 2023-08-16 DIAGNOSIS — R9431 Abnormal electrocardiogram [ECG] [EKG]: Secondary | ICD-10-CM | POA: Diagnosis not present

## 2023-08-16 DIAGNOSIS — Z5900 Homelessness unspecified: Secondary | ICD-10-CM | POA: Insufficient documentation

## 2023-08-16 DIAGNOSIS — F1210 Cannabis abuse, uncomplicated: Secondary | ICD-10-CM | POA: Insufficient documentation

## 2023-08-16 DIAGNOSIS — Z9152 Personal history of nonsuicidal self-harm: Secondary | ICD-10-CM | POA: Insufficient documentation

## 2023-08-16 DIAGNOSIS — Z765 Malingerer [conscious simulation]: Secondary | ICD-10-CM | POA: Insufficient documentation

## 2023-08-16 LAB — ACETAMINOPHEN LEVEL: Acetaminophen (Tylenol), Serum: <10 ug/mL — ABNORMAL LOW (ref 10–30)

## 2023-08-16 LAB — ETHANOL: Alcohol, Ethyl (B): <10 mg/dL (ref <10)

## 2023-08-16 LAB — COMPREHENSIVE METABOLIC PANEL
ALT: 16 U/L (ref 0–44)
AST: 22 U/L (ref 15–41)
Albumin: 3.5 g/dL (ref 3.5–5.0)
Alkaline Phosphatase: 53 U/L (ref 38–126)
Anion gap: 9 (ref 5–15)
BUN: 14 mg/dL (ref 6–20)
CO2: 25 mmol/L (ref 22–32)
Calcium: 9.1 mg/dL (ref 8.9–10.3)
Chloride: 106 mmol/L (ref 98–111)
Creatinine, Ser: 0.93 mg/dL (ref 0.61–1.24)
GFR, Estimated: >60 mL/min (ref >60)
Glucose, Bld: 123 mg/dL — ABNORMAL HIGH (ref 70–99)
Potassium: 3.9 mmol/L (ref 3.5–5.1)
Sodium: 140 mmol/L (ref 135–145)
Total Bilirubin: 0.4 mg/dL (ref 0.0–1.2)
Total Protein: 7.1 g/dL (ref 6.5–8.1)

## 2023-08-16 LAB — RAPID URINE DRUG SCREEN, HOSP PERFORMED
Amphetamines: NOT DETECTED
Barbiturates: NOT DETECTED
Benzodiazepines: NOT DETECTED
Cocaine: POSITIVE — AB
Opiates: NOT DETECTED
Tetrahydrocannabinol: POSITIVE — AB

## 2023-08-16 LAB — CBC
HCT: 39.4 % (ref 39.0–52.0)
Hemoglobin: 13.4 g/dL (ref 13.0–17.0)
MCH: 31.8 pg (ref 26.0–34.0)
MCHC: 34 g/dL (ref 30.0–36.0)
MCV: 93.4 fL (ref 80.0–100.0)
Platelets: 441 10*3/uL — ABNORMAL HIGH (ref 150–400)
RBC: 4.22 MIL/uL (ref 4.22–5.81)
RDW: 13.6 % (ref 11.5–15.5)
WBC: 10.6 10*3/uL — ABNORMAL HIGH (ref 4.0–10.5)
nRBC: 0 % (ref 0.0–0.2)

## 2023-08-16 LAB — SALICYLATE LEVEL: Salicylate Lvl: <7 mg/dL — ABNORMAL LOW (ref 7.0–30.0)

## 2023-08-16 MED ORDER — HALOPERIDOL LACTATE 5 MG/ML IJ SOLN: 10.0000 mg | Freq: Three times a day (TID) | INTRAMUSCULAR | Status: DC | PRN

## 2023-08-16 MED ORDER — ALUM & MAG HYDROXIDE-SIMETH 200-200-20 MG/5ML PO SUSP: 30.0000 mL | ORAL | Status: DC | PRN

## 2023-08-16 MED ORDER — LORAZEPAM 2 MG/ML IJ SOLN: 2.0000 mg | Freq: Three times a day (TID) | INTRAMUSCULAR | Status: DC | PRN

## 2023-08-16 MED ORDER — ACETAMINOPHEN 325 MG PO TABS: 650.0000 mg | ORAL_TABLET | Freq: Four times a day (QID) | ORAL | Status: DC | PRN

## 2023-08-16 MED ORDER — DIPHENHYDRAMINE HCL 50 MG/ML IJ SOLN: 50.0000 mg | Freq: Three times a day (TID) | INTRAMUSCULAR | Status: DC | PRN

## 2023-08-16 MED ORDER — HYDROXYZINE HCL 25 MG PO TABS: 25.0000 mg | ORAL_TABLET | Freq: Three times a day (TID) | ORAL | Status: DC | PRN

## 2023-08-16 MED ORDER — MAGNESIUM HYDROXIDE 400 MG/5ML PO SUSP: 30.0000 mL | Freq: Every day | ORAL | Status: DC | PRN

## 2023-08-16 MED ORDER — HALOPERIDOL LACTATE 5 MG/ML IJ SOLN
5.0000 mg | Freq: Three times a day (TID) | INTRAMUSCULAR | Status: DC | PRN
Start: 2023-08-16 — End: 2023-08-18

## 2023-08-16 MED ORDER — DIPHENHYDRAMINE HCL 50 MG PO CAPS: 50.0000 mg | ORAL_CAPSULE | Freq: Three times a day (TID) | ORAL | Status: DC | PRN

## 2023-08-16 MED ORDER — HALOPERIDOL 5 MG PO TABS: 5.0000 mg | ORAL_TABLET | Freq: Three times a day (TID) | ORAL | Status: DC | PRN

## 2023-08-16 MED ORDER — TRAZODONE HCL 50 MG PO TABS: 50.0000 mg | ORAL_TABLET | Freq: Every evening | ORAL | Status: DC | PRN

## 2023-08-16 NOTE — ED Notes (Signed)
 Patient in the bedroom calm and sleeping.  NAD.Respirations even and unlabored.  Will keep monitoring for safety

## 2023-08-16 NOTE — ED Triage Notes (Signed)
 The pt reports that he last had crack cocaine earlier today

## 2023-08-16 NOTE — ED Notes (Addendum)
 Report was given at 3:15 pm to [place], [place] at [facility]

## 2023-08-16 NOTE — Group Note (Signed)
 Group Topic: Relapse and Recovery  Group Date: 08/16/2023 Start Time: 2000 End Time: 2100 Facilitators: Rae Lips B  Department: [facility]  Number of Participants: 4  Group Focus: abuse issues, acceptance, and activities of daily living skills Treatment Modality:  Leisure Development Interventions utilized were leisure development Purpose: enhance coping skills, express feelings, increase insight, and relapse prevention strategies  Name: Juan Sawyer Date of Birth: 1975/08/19  MR: [id number]    Level of Participation: PT DID NOT ATTEND GROUPS Quality of Participation: cooperative Interactions with others: gave feedback Mood/Affect: appropriate Triggers (if applicable): NA Cognition: coherent/clear Progress: None Response: NA Plan: patient will be encouraged to go to groups.   Patients Problems:  Patient Active Problem List   Diagnosis Date Noted   Substance induced mood disorder (HCC) 08/16/2023   Malingering 08/13/2023   Polysubstance abuse (HCC) 08/13/2023   Cocaine use disorder, severe, dependence (HCC) 08/13/2023   Cannabis use disorder, severe, dependence (HCC) 08/13/2023   Homelessness 08/13/2023   PTSD (post-traumatic stress disorder) 08/13/2023

## 2023-08-16 NOTE — ED Triage Notes (Signed)
 The pt does not want to be here anymore  si  he was here in the past 2-3 days

## 2023-08-16 NOTE — Discharge Instructions (Addendum)
 Transfer to [facility]

## 2023-08-16 NOTE — ED Notes (Signed)
 Pt came out his room looked in the dayroom and saw his chips were thrown away. I explained to him That I was sorry we have EVS that comes and cleans at 10 and we shut down the dayroom then as well. Pt wasn't happy about that said no one explained anything to him and its not okay that  his half eaten chips that he kept on the table got thrown away and that its stupid he can't use the phone. Pt was upset and yelled all the way back to his room.

## 2023-08-16 NOTE — ED Notes (Signed)
 Patient in the bedroom and composed watching TV with other patients.  NAD,Denies needing anything atm.  Respirations even and unlabored. Will keep monitoring for safety

## 2023-08-16 NOTE — ED Notes (Signed)
 Pt dressed out in burgundy scrubs  street clothes removed  personal belongings moved to nurses station

## 2023-08-16 NOTE — ED Notes (Signed)
 Patient arrived to [facility] by [facility]. Patient is alert and oriented X 4. He denies SI/HI or AVH. Patient is irritable and easily agitated. He has stated twice since his arrival "I might just leave." Patient oriented to the unit. Patient's belongings were placed in locker #17 per security. Rules for the unit and all required paperwork reviewed and agreed to by pt. He states the only medication he can take is Tylenol. States he will not be taking any other medication. He denies physical pain or discomfort. Patient was provided a meal and beverage.

## 2023-08-16 NOTE — ED Provider Notes (Signed)
 [place] EMERGENCY DEPARTMENT AT [facility] Provider Note   CSN: [id number] Arrival date & time: 08/16/23  0046     History  Chief Complaint  Patient presents with   Medical Clearance    Juan Sawyer is a 48 y.o. male.  47 year old male presents to the emergency department for psychiatric evaluation.  He states that his suicidal ideations are "getting worse", but will not expand on any suicidal plan.  He does report use of crack cocaine earlier today.  History of polysubstance abuse.  Did present to behavioral health urgent care a few days ago, but left prior to evaluation.  States that he gets agitated when he is placed in hallways and asked the same questions by different people.  The history is provided by the patient. No language interpreter was used.       Home Medications Prior to Admission medications   Medication Sig Start Date End Date Taking? Authorizing Provider  acetaminophen (TYLENOL EX ST ARTHRITIS PAIN) 500 MG tablet Take 1,000 mg by mouth every 4 (four) hours as needed for mild pain.    [provider]  HYDROcodone-acetaminophen (NORCO/VICODIN) 5-325 MG tablet Take 1-2 tablets by mouth every 4 (four) hours as needed. 07/27/23   Elpidio Anis, PA-C      Allergies    Naprosyn [naproxen] and Nsaids    Review of Systems   Review of Systems Ten systems reviewed and are negative for acute change, except as noted in the HPI.    Physical Exam Updated Vital Signs BP 114/74 (BP Location: Right Arm)   Pulse 86   Temp 98.4 F (36.9 C)   Resp 18   Ht 5\' 10"  (1.778 m)   Wt 72.6 kg   SpO2 96%   BMI 22.97 kg/m   Physical Exam Vitals and nursing note reviewed.  Constitutional:      General: He is not in acute distress.    Appearance: He is well-developed. He is not diaphoretic.     Comments: Nontoxic appearing  HENT:     Head: Normocephalic and atraumatic.  Eyes:     General: No scleral icterus.    Conjunctiva/sclera: Conjunctivae  normal.  Pulmonary:     Effort: Pulmonary effort is normal. No respiratory distress.     Comments: Respirations even and unlabored Musculoskeletal:        General: Normal range of motion.     Cervical back: Normal range of motion.  Skin:    General: Skin is warm and dry.     Coloration: Skin is not pale.     Findings: No erythema or rash.  Neurological:     Mental Status: He is alert and oriented to person, place, and time.     Coordination: Coordination normal.  Psychiatric:        Mood and Affect: Affect is angry.        Speech: Speech normal.        Behavior: Behavior is uncooperative and agitated.        Thought Content: Thought content includes suicidal ideation. Thought content does not include suicidal plan.     ED Results / Procedures / Treatments   Labs (all labs ordered are listed, but only abnormal results are displayed) Labs Reviewed  COMPREHENSIVE METABOLIC PANEL - Abnormal; Notable for the following components:      Result Value   Glucose, Bld 123 (*)    All other components within normal limits  SALICYLATE LEVEL - Abnormal; Notable for the  following components:   Salicylate Lvl <7.0 (*)    All other components within normal limits  ACETAMINOPHEN LEVEL - Abnormal; Notable for the following components:   Acetaminophen (Tylenol), Serum <10 (*)    All other components within normal limits  CBC - Abnormal; Notable for the following components:   WBC 10.6 (*)    Platelets 441 (*)    All other components within normal limits  RAPID URINE DRUG SCREEN, HOSP PERFORMED - Abnormal; Notable for the following components:   Cocaine POSITIVE (*)    Tetrahydrocannabinol POSITIVE (*)    All other components within normal limits  ETHANOL    EKG None  Radiology No results found.  Procedures Procedures    Medications Ordered in ED Medications - No data to display  ED Course/ Medical Decision Making/ A&P                                 Medical Decision  Making Amount and/or Complexity of Data Reviewed Labs: ordered.   Patient medically cleared and pending psychiatric assessment.  Disposition to be determined by oncoming ED provider following completion of TTS evaluation.        Final Clinical Impression(s) / ED Diagnoses Final diagnoses:  Polysubstance abuse (HCC)  Depression, unspecified depression type    Rx / DC Orders ED Discharge Orders     None         Antony Madura, PA-C 08/16/23 0553    Sabas Sous, MD 08/16/23 [phone]

## 2023-08-16 NOTE — ED Provider Notes (Signed)
 BH team indicates patient accepted to [facility] area, Dr Enedina Finner accepting doc.   Pt alert, content, no distress. Vitals normal.  Pt currently appears stable for transfer/transport.    Cathren Laine, MD 08/16/23 1056

## 2023-08-16 NOTE — ED Triage Notes (Signed)
 Pt wanded   by security 2 bags of pt belongings placed in locker number 1 in purple

## 2023-08-16 NOTE — ED Notes (Signed)
 Patient denies pain and is resting comfortably.
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
## 2023-08-16 NOTE — Progress Notes (Signed)
 [facility] LCSW Progress Note   08/16/2023    12:39 PM  Shafin Pollio   [phone]   Type of Contact and Topic:  Psychiatric Bed Placement   Pt accepted to [facility]   Patient meets inpatient criteria per Eligha Bridegroom, NP   The attending provider will be Dr. Sol Passer  Call report to [phone]  Roseanne Reno, RN @ [facility] ED notified.     Pt scheduled  to arrive at TODAY.    Damita Dunnings, MSW, LCSW-A  12:40 PM 08/16/2023

## 2023-08-17 DIAGNOSIS — Z9151 Personal history of suicidal behavior: Secondary | ICD-10-CM | POA: Diagnosis not present

## 2023-08-17 DIAGNOSIS — R45851 Suicidal ideations: Secondary | ICD-10-CM | POA: Diagnosis not present

## 2023-08-17 DIAGNOSIS — F1994 Other psychoactive substance use, unspecified with psychoactive substance-induced mood disorder: Secondary | ICD-10-CM | POA: Diagnosis not present

## 2023-08-17 DIAGNOSIS — F32A Depression, unspecified: Secondary | ICD-10-CM | POA: Diagnosis not present

## 2023-08-17 MED ORDER — ENSURE ENLIVE PO LIQD: 237.0000 mL | Freq: Three times a day (TID) | ORAL | Status: DC | PRN

## 2023-08-17 NOTE — Group Note (Signed)
 Group Topic: Relaxation  Group Date: 08/17/2023 Start Time: 1630 End Time: 1700 Facilitators: Vonzell Schlatter B  Department: [facility]  Number of Participants: 4  Group Focus: communication and concentration Treatment Modality:  Psychoeducation Interventions utilized were problem solving and support Purpose: enhance coping skills and express feelings  Name: Juan Sawyer Date of Birth: [birth year]  MR: [id number]    Level of Participation: Did not attend group Quality of Participation:  Interactions with others:  Mood/Affect:  Triggers (if applicable):  Cognition:  Progress:  Response:  Plan: follow-up needed  Patients Problems:  Patient Active Problem List   Diagnosis Date Noted   Substance induced mood disorder (HCC) 08/16/2023   Malingering 08/13/2023   Polysubstance abuse (HCC) 08/13/2023   Cocaine use disorder, severe, dependence (HCC) 08/13/2023   Cannabis use disorder, severe, dependence (HCC) 08/13/2023   Homelessness 08/13/2023   PTSD (post-traumatic stress disorder) 08/13/2023

## 2023-08-17 NOTE — ED Notes (Signed)
Patient has been IVC'd 

## 2023-08-17 NOTE — Group Note (Signed)
 Group Topic: Recovery Basics  Group Date: 08/17/2023 Start Time: 1230 End Time: 1300 Facilitators: Jenean Lindau, RN  Department: [facility]  Number of Participants: 8  Group Focus: chemical dependency education, chemical dependency issues, and clarity of thought Treatment Modality:  Behavior Modification Therapy Interventions utilized were exploration and patient education Purpose: enhance coping skills, explore maladaptive thinking, express feelings, express irrational fears, improve communication skills, increase insight, regain self-worth, reinforce self-care, relapse prevention strategies, and trigger / craving management  Name: Titan Karner Date of Birth: 05-Nov-1975  MR: [id number]    Level of Participation: did not attend Quality of Participation:  Interactions with others:  Mood/Affect:  Triggers (if applicable):  Cognition:  Progress:  Response:  Plan:   Patients Problems:  Patient Active Problem List   Diagnosis Date Noted   Substance induced mood disorder (HCC) 08/16/2023   Malingering 08/13/2023   Polysubstance abuse (HCC) 08/13/2023   Cocaine use disorder, severe, dependence (HCC) 08/13/2023   Cannabis use disorder, severe, dependence (HCC) 08/13/2023   Homelessness 08/13/2023   PTSD (post-traumatic stress disorder) 08/13/2023
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
## 2023-08-17 NOTE — ED Provider Notes (Addendum)
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 - 44 U/L Final   Alkaline Phosphatase 03/20/2023 71  38 - 126 U/L Final   Total Bilirubin 03/20/2023 0.8  0.3 - 1.2 mg/dL Final   GFR, Estimated 03/20/2023 >60  >60 mL/min Final   Comment: (NOTE) Calculated using the CKD-EPI Creatinine Equation (2021)    Anion gap 03/20/2023 9  5 - 15 Final   Performed at [facility], [address] [place], [place] [address]    Allergies: Naprosyn [naproxen] and Nsaids  Medications:  Facility Ordered  Medications  Medication   acetaminophen (TYLENOL) tablet 650 mg   alum & mag hydroxide-simeth (MAALOX/MYLANTA) 200-200-20 MG/5ML suspension 30 mL   magnesium hydroxide (MILK OF MAGNESIA) suspension 30 mL   haloperidol (HALDOL) tablet 5 mg   And   diphenhydrAMINE (BENADRYL) capsule 50 mg   haloperidol lactate (HALDOL) injection 5 mg   And   diphenhydrAMINE (BENADRYL) injection 50 mg   And   LORazepam (ATIVAN) injection 2 mg   haloperidol lactate (HALDOL) injection 10 mg   And   diphenhydrAMINE (BENADRYL) injection 50 mg   And   LORazepam (ATIVAN) injection 2 mg   hydrOXYzine (ATARAX) tablet 25 mg   traZODone (DESYREL) tablet 50 mg   PTA Medications  Medication Sig   acetaminophen (TYLENOL EX ST ARTHRITIS PAIN) 500 MG tablet Take 1,000 mg by mouth every 4 (four) hours as needed for mild pain.   HYDROcodone-acetaminophen (NORCO/VICODIN) 5-325 MG tablet Take 1-2 tablets by mouth every 4 (four) hours as needed.    Long Term Goals: Improvement in symptoms so as ready for discharge  Short Term Goals: Patient will verbalize feelings in meetings with treatment team members., Patient will attend at least of 50% of the groups daily., and Patient will participate in completing the [place] Suicide Severity Rating Scale  Medical Decision Making  Patient has decision making capacity.  Evaluation: 18 y M with prior diagnosis of PTSD, MDD and current cocaine and cannabis use disorders. Denies any history of manic episodes outside of stimulant induced episodes which patient laughed about. Patient reports primarily depressive symptoms at this time and hopelessness. I discussed numerous evidenced based options for PTSD and depression treatments for him including Zoloft, Paxil, Seroquel and Prazosin however patient declined. Patient is future oriented today and denies SI, HI or AVH. Patient was again demanding housing throughout evaluation so there is suspicion for secondary gain. Patient does report  prior suicide attempts. Will continue current admission given SI yesterday although there is likely a malingering component to patient's presentation.  Patient appears primarily interested in housing and finding a job as he was dismissive of my recommendations for psychotherapy.  Will consider discharge tomorrow if patient continues to deny SI or HI and remain future oriented, if patient is not interested in services here. Patient was placed under IVC due to reporting worsening SI the last several day, ongoing substance abuse and  multiple prior suicide attempts. If patient continues to remains stable and deny SI discussed he can be discharged tomorrow.     Recommendations  Based on my evaluation the patient does not appear to have an emergency medical condition. -patient declines any medications - encouraged patient to attend therapy groups - encouraged consideration for outpatient psychiatric and psycotherapy followup  Miguel Rota, MD 08/17/23  11:10 AM

## 2023-08-17 NOTE — ED Notes (Signed)
 Pt awake this time, asking if it was time to eat breakfast.RN told him not yet and he talked backed saying, well my clock in my room is not working and my watch stopped working so I don't know what the time is.

## 2023-08-17 NOTE — ED Notes (Signed)
 Patient alternates between day room and his room.  He is defensive and irritable on approach.  Patient is avoidant.  Breakfast was given.  Denies avh shi or plan.  Will monitor and set limits as needed.

## 2023-08-17 NOTE — Group Note (Signed)
 Group Topic: Positive Affirmations  Group Date: 08/17/2023 Start Time: 2030 End Time: 2045 Facilitators: Lauro Grover [place], NT  Department: [facility]  Number of Participants: 5  Group Focus: acceptance, affirmation, and feeling awareness/expression Treatment Modality:  Behavior Modification Therapy and Cognitive Behavioral Therapy Interventions utilized were clarification, leisure development, and support Purpose: express feelings, improve communication skills, and increase insight  Name: Juan Sawyer Date of Birth: 1975/11/10  MR: [id number]    Level of Participation: PT DID NOT ATTEND GROUP. Quality of Participation: cooperative Interactions with others: gave feedback Mood/Affect: appropriate Triggers (if applicable): N/A Cognition: coherent/clear Progress: Gaining insight Response: N/A Plan: patient will be encouraged to attend group.  Patients Problems:  Patient Active Problem List   Diagnosis Date Noted   Substance induced mood disorder (HCC) 08/16/2023   Malingering 08/13/2023   Polysubstance abuse (HCC) 08/13/2023   Cocaine use disorder, severe, dependence (HCC) 08/13/2023   Cannabis use disorder, severe, dependence (HCC) 08/13/2023   Homelessness 08/13/2023   PTSD (post-traumatic stress disorder) 08/13/2023

## 2023-08-17 NOTE — ED Notes (Signed)
 Patient is still not happy about not being discharged however he has been calm and cooperative.  No behavioral issue at this time.

## 2023-08-18 ENCOUNTER — Other Ambulatory Visit: Payer: Self-pay

## 2023-08-18 ENCOUNTER — Emergency Department (HOSPITAL_COMMUNITY)
Admission: EM | Admit: 2023-08-18 | Discharge: 2023-08-19 | Discharge status: Against Medical Advice | Attending: Emergency Medicine | Admitting: Emergency Medicine

## 2023-08-18 ENCOUNTER — Emergency Department (HOSPITAL_COMMUNITY)

## 2023-08-18 ENCOUNTER — Encounter (HOSPITAL_COMMUNITY): Payer: Self-pay

## 2023-08-18 DIAGNOSIS — Z5321 Procedure and treatment not carried out due to patient leaving prior to being seen by health care provider: Secondary | ICD-10-CM | POA: Insufficient documentation

## 2023-08-18 DIAGNOSIS — M25511 Pain in right shoulder: Secondary | ICD-10-CM | POA: Diagnosis not present

## 2023-08-18 DIAGNOSIS — F1994 Other psychoactive substance use, unspecified with psychoactive substance-induced mood disorder: Secondary | ICD-10-CM | POA: Diagnosis not present

## 2023-08-18 DIAGNOSIS — R45851 Suicidal ideations: Secondary | ICD-10-CM | POA: Diagnosis not present

## 2023-08-18 DIAGNOSIS — Z9151 Personal history of suicidal behavior: Secondary | ICD-10-CM | POA: Diagnosis not present

## 2023-08-18 DIAGNOSIS — F32A Depression, unspecified: Secondary | ICD-10-CM | POA: Diagnosis not present

## 2023-08-18 NOTE — Group Note (Signed)
 Group Topic: Relapse and Recovery  Group Date: 08/18/2023 Start Time: 1054 End Time: 1056 Facilitators: Cindy Fullman, Jacklynn Barnacle, RN  Department: [facility]  Number of Participants: 1  Group Focus: discharge education Treatment Modality:  Individual Therapy Interventions utilized were patient education Purpose: Discharge education  Name: Juan Sawyer Date of Birth: 1975/08/22  MR: [id number]    Level of Participation: moderate Quality of Participation: attentive Interactions with others: gave feedback Mood/Affect: appropriate and bored Triggers (if applicable): None identified Cognition: coherent/clear Progress: Moderate Response: Patient voiced understanding of discharge instructions, no questions at this time. Plan: patient will be encouraged to reach out if additional care is needed.  Patients Problems:  Patient Active Problem List   Diagnosis Date Noted   Substance induced mood disorder (HCC) 08/16/2023   Malingering 08/13/2023   Polysubstance abuse (HCC) 08/13/2023   Cocaine use disorder, severe, dependence (HCC) 08/13/2023   Cannabis use disorder, severe, dependence (HCC) 08/13/2023   Homelessness 08/13/2023   PTSD (post-traumatic stress disorder) 08/13/2023

## 2023-08-18 NOTE — Discharge Instructions (Signed)
 [facility] [address], [place], [address] [phone] phone  New Patient Assessment/Therapy Walk-Ins:  Monday and Wednesday: 8 am until slots are full. Every 1st and 2nd Fridays of the month: 1 pm - 5 pm.  NO ASSESSMENT/THERAPY WALK-INS ON TUESDAYS OR THURSDAYS  New Patient Assessment/Medication Management Walk-Ins:  Monday - Friday:  8 am - 11 am.  For all walk-ins, we ask that you arrive by 7:30 am because patients will be seen in the order of arrival.  Availability is limited; therefore, you may not be seen on the same day that you walk-in.  Our goal is to serve and meet the needs of our community to the best of our [place] ability.  SUBSTANCE USE TREATMENT for Medicaid and State Funded/IPRS  [facility] [address], [place], [address] [phone] phone NOTE: [facility] is no longer offering IOP services.  Serves those who are low-income or have no insurance.  [facility] [address] [place], [place], [address] [phone] phone [phone] fax NOTE: Does have Substance Abuse-Intensive Outpatient Program [facility]) as well as transitional housing if eligible.  [facility] [address] [place], [place], [address] [phone] phone [phone] fax  [facility] [phone] [place], [place], [address] [phone] phone [phone] fax  HALFWAY HOUSES:  [facility] [phone]  [employer].oxfordvacancies.com  12 STEP PROGRAMS:  Alcoholics Anonymous of [place] [employer]alet.be  Narcotics Anonymous of [place] [employer]ct[employer]dk  Al-Anon of [place], [place] www.[place]alanon.org/find-meetings.html  Nar-Anon https://nar-anon.org/find-a-meetin  List of Residential placements:   [facility] in [place]: [phone]  [facility]: [phone]  [place], [place]  [phone]: Male and male facility; 30-day program: (uninsured and Medicaid such as Laurena Bering, [place], [place], partners)  [facility]: [phone]; men and women's facility; 28 days; Can have Medicaid tailored plan Tour manager or Partners)  [facility]: [phone] Karoline Caldwell or Larita Fife; 28 day program; must be fully detox; tailored Medicaid or no insurance  [address] in [place], [place]; [phone]; 28 day all males program; no insurance accepted  [facility] in [place]: Gabriel Rung [phone] (no insurance or Medicaid only); 90 days; outpatient services but provide housing in apartments [place]  [facility] Admission: [phone]: Patient must complete phone screening for placement: [place], [place]; 6 month program; uninsured, Medicaid, and [employer].   [facility]: no insurance required; [phone]  [facility]: [phone]; Intake: Molly Maduro; Must fill out application online; Alecia Lemming Delay [phone] x [address] [facility] in [place], [place]: [phone]; Admissions Coordinators Mr. Maurine Minister or Barron Alvine; 90 day program.  [facility]: [place], [place] [phone]; Co-Ed 9 month to a year program; Online application; Men entry fee is $500 (6-53months);  [employer]: [address] [place], [place] [address]; no fee or insurance required; minimum of 2 years; Highly structured; work based; Intake Coordinator is Thayer Ohm [phone]  [facility] in [place], [place]: [phone]; Fax number is [phone]; website: www.[facility].org; Requires 3-6 page autobiography; 2 year program (18 months and then 25month transitional housing); Admission fee is $300; no insurance needed; work Automotive engineer in [place], [place]: [employer] Desk Staff: Danise Edge [phone]: They have a Men's Regenerations Program 6-39months. Free program; There is an initial $300 fee however, they are willing to work  with patients regarding that. Application is online.  [facility]: Admissions [phone] Doran Heater ext [phone]; Any 7-90 day program is out of pocket; 12  month program is free of charge; there is a $275 entry fee; Patient is responsible for own transportation

## 2023-08-18 NOTE — ED Notes (Signed)
 Patient discharged home per MD order. After Visit Summary (AVS) printed and given to patient. AVS reviewed with patient and all questions fully answered. Patient discharged in no acute distress, A& O x4 and ambulatory. Patient denied SI/HI, A/VH upon discharge. Patient verbalized understanding of all discharge instructions explained by staff, including safety plan. Patient mood fair.  Patient belongings returned to patient complete and intact. Patient escorted to lobby via staff for transport to destination. Safety maintained.

## 2023-08-18 NOTE — ED Notes (Signed)
 Patient alert & oriented x4. Denies intent to harm self or others when asked. Denies A/VH. Patient reports shoulder pain rating 8/10, denies need of pain medication at this time. No acute distress noted. Support and encouragement provided. Routine safety checks conducted per facility protocol. Encouraged patient to notify staff if any thoughts of harm towards self or others arise. Patient verbalizes understanding and agreement.

## 2023-08-18 NOTE — ED Notes (Signed)
 Patient in bedroom, calm and composed. Requesting d/c at this time, patient is currently in IVC status. Patient made aware that providers have not arrived yet, patient voiced understanding. No acute distress noted. No concerns voiced. Informed patient to notify staff with any needs or assistance. Patient verbalized understanding or agreement. Safety checks in place per facility policy.

## 2023-08-18 NOTE — Discharge Planning (Signed)
 LCSW went and spoke with patient in attempt to gather further information. Patient reports he would like to be discharged at this time. When asked regarding his plan, patient reports "I will figure it out when I get out". LCSW explored where patient lived and patient stated "I don't live anywhere at the moment, but I may be going back to [place] where my mom stays that's why I'm trying to get out of here". Patient reported that he will take local outpatient resources just in case his plans fall through. Patient reports he is waiting to be discharge and no other needs were reported. MD aware that patient is requesting discharge. Resources added in AVS. LCSW to sign off at this time. Please inform if further LCSW needs arise prior to discharge.   Fernande Boyden, LCSW Clinical Social Worker [facility] Ph: [phone]

## 2023-08-18 NOTE — ED Triage Notes (Signed)
 Patient presents with shoudler pain that started 1 month ago. Patient was seen here for the shoulder pain. Pain rated 8/10 in the right shoulder. State it effects his sleeping and his ability to work. States he has a hard time picking the shoulder up.

## 2023-08-18 NOTE — ED Provider Notes (Signed)
 FBC/OBS ASAP Discharge Summary  Date and Time: 08/18/2023 9:32 AM  Name: Juan Sawyer  MRN:  [id number]   Discharge Diagnoses:  Final diagnoses:  Cocaine dependence with intoxication and without complication [facility])  Cannabis use disorder  Substance induced mood disorder (HCC)  Likely malingering component as patient perseverated on housing and finding a job throughout 2 days of admission and rejected medication management, psychotherapy and rehab referrals.  Subjective: Patient is euthymic, grateful and no longer irritable today. Patient apologetic stating "I'm sorry for what happened yesterday" and is smiling. Denies SI or HI. Denies AVH. Patient states he is interested in talking with case manager about resources four housing and work but declines CDIOP or inpatient rehab. Reports good sleep and appetite.  Stay Summary:   Patient was admitted to inpatient psychiatry at [facility] for safety and stabilization. Patient was provided safe and therapeutic milieu, psychiatric and medical assessment, care and treatment, as well as support from nursing, behavioral health staff.   Patient mostly isolated to room with entitled demands that we should find him housing and a job which he perseverated on throughout admission. Patient refused to start any medications during admission for depression or anxiety which appeared to be substance induced. Patient tolerated without side effects and medications were titrated to therapeutic effect. As patient stabilized on medications and participated in therapeutic interventions, symptoms began to improve.  Patient initially requested rehab services prior to admission but was volitionally unengaged in therapy service and also declined inpatient rehab, IOP or medication management. Patient was initially placed on IVC due to having voiced increasing SI prompting admission with multiple prior suicide attempts. Patient denied SI for >48 hour prior to discharge.  On the day of  discharge, the chart was reviewed, case was discussed with staff and the patient was seen in person. Patient's overall mood has improved. Patient was calm and cooperative and did not appear anxious, patient calm and euthymic and was apologetic to me about his behaviors yesterday stating he was upset because he had a friend that was picking him up. Patient reported adequate sleep and stable mood. Patient was tolerating medications well without side effects reported or noted. Patient denied suicidal ideation, plan or intent, denied hopelessness, helplessness or worthlessness, and denied homicidal ideation. Insight and judgement have improved. Patient demonstrated future orientation and was motivated to follow-up with aftercare. Patient was encouraged to be adherent with medications. Patient was instructed to call 911, ask for help to go to the closest emergency room or crisis center, call crisis hotlines for help if in critical status or when symptoms were worsening. Patient voiced understanding of this information. At the time of discharge, patient had reached maximum benefit from hospitalization, was no longer considered to be dangerous to self or others, and was psychiatrically stable and otherwise appropriate for discharge to less restrictive care in the community.   Medical Hospital Course: Patient was seen by the hospitalist for routine admission examination. Medications for chronic conditions were continued.  Medical hospital course was otherwise unremarkable.     Total Time spent with patient: 20 minutes  Prev Dx/Sx: PTSD Prior suicide attempts: patient reports 1 prior OD attempt as a child and also thinking about hanging himself as a child Current Psych Provider: none Home Meds (current): none Previous Med Trials: trileptal as a child, pt reported he did not like it/zombie feelings Therapy: when he was a child   Prior Psych Hospitalization: adolescent IP  Prior Self Harm: yes Prior Violence:  no  Family Psych History: unknown family hx   Family Psychiatric History: patient states he does not know Social History: has been homeless and umemployed for the past year. Tobacco Cessation:  N/A, patient does not currently use tobacco products  Current Medications:  Current Facility-Administered Medications  Medication Dose Route Frequency Provider Last Rate Last Admin   acetaminophen (TYLENOL) tablet 650 mg  650 mg Oral Q6H PRN Eligha Bridegroom, NP       alum & mag hydroxide-simeth (MAALOX/MYLANTA) 200-200-20 MG/5ML suspension 30 mL  30 mL Oral Q4H PRN Eligha Bridegroom, NP       haloperidol (HALDOL) tablet 5 mg  5 mg Oral TID PRN Eligha Bridegroom, NP       And   diphenhydrAMINE (BENADRYL) capsule 50 mg  50 mg Oral TID PRN Eligha Bridegroom, NP       haloperidol lactate (HALDOL) injection 5 mg  5 mg Intramuscular TID PRN Eligha Bridegroom, NP       And   diphenhydrAMINE (BENADRYL) injection 50 mg  50 mg Intramuscular TID PRN Eligha Bridegroom, NP       And   LORazepam (ATIVAN) injection 2 mg  2 mg Intramuscular TID PRN Eligha Bridegroom, NP       haloperidol lactate (HALDOL) injection 10 mg  10 mg Intramuscular TID PRN Eligha Bridegroom, NP       And   diphenhydrAMINE (BENADRYL) injection 50 mg  50 mg Intramuscular TID PRN Eligha Bridegroom, NP       And   LORazepam (ATIVAN) injection 2 mg  2 mg Intramuscular TID PRN Eligha Bridegroom, NP       feeding supplement (ENSURE ENLIVE / ENSURE PLUS) liquid 237 mL  237 mL Oral TID PRN Miguel Rota, MD       hydrOXYzine (ATARAX) tablet 25 mg  25 mg Oral TID PRN Eligha Bridegroom, NP       magnesium hydroxide (MILK OF MAGNESIA) suspension 30 mL  30 mL Oral Daily PRN Eligha Bridegroom, NP       traZODone (DESYREL) tablet 50 mg  50 mg Oral QHS PRN Eligha Bridegroom, NP       No current outpatient medications on file.    PTA Medications:  Facility Ordered Medications  Medication   acetaminophen (TYLENOL) tablet 650 mg   alum & mag  hydroxide-simeth (MAALOX/MYLANTA) 200-200-20 MG/5ML suspension 30 mL   magnesium hydroxide (MILK OF MAGNESIA) suspension 30 mL   haloperidol (HALDOL) tablet 5 mg   And   diphenhydrAMINE (BENADRYL) capsule 50 mg   haloperidol lactate (HALDOL) injection 5 mg   And   diphenhydrAMINE (BENADRYL) injection 50 mg   And   LORazepam (ATIVAN) injection 2 mg   haloperidol lactate (HALDOL) injection 10 mg   And   diphenhydrAMINE (BENADRYL) injection 50 mg   And   LORazepam (ATIVAN) injection 2 mg   hydrOXYzine (ATARAX) tablet 25 mg   traZODone (DESYREL) tablet 50 mg   feeding supplement (ENSURE ENLIVE / ENSURE PLUS) liquid 237 mL       08/18/2023    5:00 PM 08/18/2023    9:32 AM  Depression screen PHQ 2/9  Decreased Interest 1 0  Down, Depressed, Hopeless 1 0  PHQ - 2 Score 2 0  Altered sleeping 1   Tired, decreased energy 0   Change in appetite 1   Feeling bad or failure about yourself  1   Trouble concentrating 0   Moving slowly or fidgety/restless 0   Suicidal thoughts 0  PHQ-9 Score 5     Flowsheet Row ED from 08/16/2023 in [facility] Most recent reading at 08/16/2023  4:30 PM ED from 08/16/2023 in [facility] Emergency Department at [facility] Most recent reading at 08/16/2023  1:01 AM ED from 08/13/2023 in [facility] Most recent reading at 08/13/2023 12:15 PM  C-SSRS RISK CATEGORY High Risk High Risk No Risk      C-SSRS Risk on 3/1: low acute risk for suicide, patient denying SI or wanting to be dead for 48 hours and is future oriented on work and finding a place to live Musculoskeletal  Strength & Muscle Tone: within normal limits Gait & Station: normal Patient leans: N/A  Psychiatric Specialty Exam  Presentation  General Appearance:  Disheveled  Eye Contact: Fair  Speech: Normal Rate  Speech Volume: Normal  Handedness:No data recorded  Mood and Affect  Mood: Euthymic and pleasnat  Affect: Full  Range   Thought Process  Thought Processes: Goal Directed  Descriptions of Associations:Intact  Orientation:Full (Time, Place and Person)  Thought Content:WDL  Diagnosis of Schizophrenia or Schizoaffective disorder in past: No    Hallucinations:Hallucinations: None  Ideas of Reference:None  Suicidal Thoughts:Suicidal Thoughts: No  Homicidal Thoughts:Homicidal Thoughts: No   Sensorium  Memory: Immediate Fair  Judgment: Fair  Insight: Fair   Art therapist  Concentration: Fair  Attention Span:No data recorded Recall:No data recorded Fund of Knowledge:No data recorded Language:No data recorded  Psychomotor Activity  Psychomotor Activity:No data recorded  Assets  Assets:No data recorded  Sleep  Sleep:No data recorded  Nutritional Assessment (For OBS and FBC admissions only) Has the patient had a weight loss or gain of 10 pounds or more in the last 3 months?: No Has the patient had a decrease in food intake/or appetite?: Yes Does the patient have dental problems?: No Has the patient recently lost weight without trying?: 2.0 Has the patient been eating poorly because of a decreased appetite?: 0 Malnutrition Screening Tool Score: 2    Physical Exam  Physical exam: Please see exam on admit note. General: Well developed, well nourished.  Pupils: Normal at 3mm Respiratory: Breathing is unlabored.  Cardiovascular: No edema.  Language: No anomia, no aphasia Muscle strength and tone-pt moving all extremities.  Gait not assessed as pt remained in bed.  Neuro: Facial muscles are symmetric. Pt without tremor, no evidence of hyperarousal.  Review of Systems  Constitutional: Negative.   HENT: Negative.    Eyes: Negative.   Respiratory: Negative.    Cardiovascular: Negative.   Gastrointestinal: Negative.   Genitourinary: Negative.   Musculoskeletal:  Positive for joint pain.  Skin: Negative.   Neurological: Negative.   Endo/Heme/Allergies:  Negative.   Psychiatric/Behavioral:  The patient is nervous/anxious.    Blood pressure 105/66, pulse 85, temperature 98.7 F (37.1 C), temperature source Oral, resp. rate 20, SpO2 100%. There is no height or weight on file to calculate BMI.  Demographic Factors:  Male  Loss Factors: Decrease in vocational status  Historical Factors: Prior suicide attempts and Impulsivity  Risk Reduction Factors:   NA  Continued Clinical Symptoms:  Alcohol/Substance Abuse/Dependencies  Cognitive Features That Contribute To Risk:  None    Suicide Risk:  Mild:  Suicidal ideation of limited frequency, intensity, duration, and specificity.  There are no identifiable plans, no associated intent, mild dysphoria and related symptoms, good self-control (both objective and subjective assessment), few other risk factors, and identifiable protective factors, including available and accessible social  support.  Plan Of Care/Follow-up recommendations:  Activity:  as tolerated Diet:  regular  Disposition: -discharge home with outpatient rehab referral; patient requested discharge yesterday and no longer meets IVC criteria today; appears to be precontemplative with respect to stimulant cessation; there may also be a malingering component as patient was only asking for housing and a job throughout the whole admission.   Miguel Rota, MD 08/18/2023, 9:32 AM

## 2023-08-19 NOTE — ED Notes (Signed)
Called pt for vitals x3; no answer 

## 2023-08-20 ENCOUNTER — Ambulatory Visit (HOSPITAL_COMMUNITY)
Admission: EM | Admit: 2023-08-20 | Discharge: 2023-08-20 | Disposition: A | Discharge status: Home / Self Care | Attending: Psychiatry | Admitting: Psychiatry

## 2023-08-20 DIAGNOSIS — F4310 Post-traumatic stress disorder, unspecified: Secondary | ICD-10-CM | POA: Diagnosis not present

## 2023-08-20 DIAGNOSIS — Z560 Unemployment, unspecified: Secondary | ICD-10-CM | POA: Insufficient documentation

## 2023-08-20 DIAGNOSIS — F1420 Cocaine dependence, uncomplicated: Secondary | ICD-10-CM | POA: Diagnosis present

## 2023-08-20 DIAGNOSIS — F1410 Cocaine abuse, uncomplicated: Secondary | ICD-10-CM | POA: Insufficient documentation

## 2023-08-20 DIAGNOSIS — Z5900 Homelessness unspecified: Secondary | ICD-10-CM | POA: Insufficient documentation

## 2023-08-20 DIAGNOSIS — Z9151 Personal history of suicidal behavior: Secondary | ICD-10-CM | POA: Insufficient documentation

## 2023-08-20 DIAGNOSIS — F1220 Cannabis dependence, uncomplicated: Secondary | ICD-10-CM | POA: Diagnosis present

## 2023-08-20 DIAGNOSIS — Z765 Malingerer [conscious simulation]: Secondary | ICD-10-CM | POA: Diagnosis not present

## 2023-08-20 DIAGNOSIS — F1994 Other psychoactive substance use, unspecified with psychoactive substance-induced mood disorder: Secondary | ICD-10-CM | POA: Diagnosis present

## 2023-08-20 DIAGNOSIS — F329 Major depressive disorder, single episode, unspecified: Secondary | ICD-10-CM | POA: Insufficient documentation

## 2023-08-20 LAB — POCT URINE DRUG SCREEN - MANUAL ENTRY (I-SCREEN)
POC Amphetamine UR: NOT DETECTED
POC Buprenorphine (BUP): NOT DETECTED
POC Cocaine UR: POSITIVE — AB
POC Marijuana UR: POSITIVE — AB
POC Methadone UR: NOT DETECTED
POC Methamphetamine UR: NOT DETECTED
POC Morphine: NOT DETECTED
POC Oxazepam (BZO): NOT DETECTED
POC Oxycodone UR: NOT DETECTED
POC Secobarbital (BAR): NOT DETECTED

## 2023-08-20 MED ORDER — OLANZAPINE 10 MG IM SOLR: 5.0000 mg | Freq: Three times a day (TID) | INTRAMUSCULAR | Status: DC | PRN

## 2023-08-20 MED ORDER — MAGNESIUM HYDROXIDE 400 MG/5ML PO SUSP: 30.0000 mL | Freq: Every day | ORAL | Status: DC | PRN

## 2023-08-20 MED ORDER — OLANZAPINE 10 MG IM SOLR: 10.0000 mg | Freq: Three times a day (TID) | INTRAMUSCULAR | Status: DC | PRN

## 2023-08-20 MED ORDER — ACETAMINOPHEN 325 MG PO TABS
650.0000 mg | ORAL_TABLET | Freq: Four times a day (QID) | ORAL | Status: DC | PRN
  Administered 2023-08-20: 650 mg via ORAL
  Filled 2023-08-20: qty 2

## 2023-08-20 MED ORDER — OLANZAPINE 5 MG PO TBDP: 5.0000 mg | ORAL_TABLET | Freq: Three times a day (TID) | ORAL | Status: DC | PRN

## 2023-08-20 MED ORDER — ALUM & MAG HYDROXIDE-SIMETH 200-200-20 MG/5ML PO SUSP: 30.0000 mL | ORAL | Status: DC | PRN

## 2023-08-20 NOTE — Progress Notes (Signed)
 Pt states that he does not want to go to [facility] and is asking ing to be discharged.

## 2023-08-20 NOTE — ED Provider Notes (Signed)
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 He does not appear to be responding to any internal symptoms. He denies any substance withdrawal symptoms.  At this time, patient does not meet criteria for IVC.  Recommendations  Based on my evaluation the patient does not appear to have an emergency medical condition.  Armandina Stammer, NP, pmhnp, fnp-bc. 08/20/23  11:47 AM

## 2023-08-20 NOTE — ED Notes (Signed)
 Patient has been brought on unit, familiarized with unit, patient has eaten and is now lying in bed quietly in no acute distress.
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
## 2023-08-20 NOTE — ED Provider Notes (Signed)
 [facility] Continuous Assessment Admission H&P  Date: 08/20/23 Patient Name: Juan Sawyer MRN: [id number] Chief Complaint: needing treatment for cocaine abuse  Diagnoses:  Final diagnoses:  Cocaine abuse [facility])  Homelessness  Malingering    HPI: Juan Sawyer,  48 y/o male with a history of substance abuse, suicidal ideation, PTSD, wanted to [facility] anteriorly.  Per the patient he needs to get help with cocaine abuse.  Review of patient records show that patient was discharge On 08/18/2023 he was given resources but denies interest in the resources he was given.  According to the discharge notes patient had refused CDIOP or inpatient rehab.  Further patient he has been using crack cocaine since he was discharged patient reports he is currently homeless, according to patient he does not care if he lived or died.   Copied from prior notes: Patient initially requested rehab services prior to admission but was volitionally unengaged in therapy service and also declined inpatient rehab, IOP or medication management.   Face-to-face observation of patient, patient is alert and oriented x 4, speech is clear, maintained minimal eye contact.  Patient is groomed, affect is flat.  Congruent with mood.  Patient denies SI, HI, AVH or paranoia.  Reports he use cocaine since leaving.  According to patient he has been using cocaine and drugs since he was 48 years old.  According to patient he does not have any family in the area stating that all his family either died or they do not want to have anything to do with him.  Patient does not appear to be in any distress at this time.  Patient does however seem to be malingering as patient was admitted and discharged 3 days ago and since then has not made any changes.  Patient does seem to be seeking secondary gain.  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  Recommend observation  Total Time spent with patient: 20 minutes  Musculoskeletal  Strength & Muscle Tone: within normal limits Gait & Station: normal Patient leans: N/A  Psychiatric Specialty Exam  Presentation General Appearance:  Casual  Eye Contact: Fair  Speech: Clear and Coherent  Speech Volume: Normal  Handedness: Right   Mood and Affect  Mood: Anxious; Irritable  Affect: Full Range   Thought Process  Thought Processes: Goal Directed  Descriptions of Associations:Intact  Orientation:Full (Time, Place and Person)  Thought Content:WDL  Diagnosis of Schizophrenia or Schizoaffective disorder in past: No   Hallucinations:Hallucinations: None  Ideas of Reference:None  Suicidal Thoughts:Suicidal Thoughts: No  Homicidal Thoughts:Homicidal Thoughts: No   Sensorium  Memory: Immediate Fair  Judgment: Poor  Insight: Poor   Executive Functions  Concentration: Fair  Attention Span: Good  Recall: Fair  Fund of Knowledge: Fair  Language: Fair   Psychomotor Activity  Psychomotor Activity: Psychomotor Activity: Normal   Assets  Assets: Desire for Improvement; Housing   Sleep  Sleep: Sleep: Fair Number of Hours of Sleep: 6   Nutritional Assessment (For OBS and FBC admissions only) Has the patient had a weight loss or gain of 10 pounds or more in the last 3 months?: No Has the patient had a decrease in food intake/or appetite?: No Does the patient have dental problems?: No Does the patient have eating habits or behaviors that may be indicators of an eating disorder including binging  or inducing vomiting?: No Has the patient recently lost weight without trying?: 0 Has the patient been eating poorly because of a decreased appetite?: 0 Malnutrition Screening Tool Score: 0    Physical Exam HENT:     Head: Normocephalic.     Nose: Nose normal.  Eyes:     Pupils: Pupils  are equal, round, and reactive to light.  Cardiovascular:     Rate and Rhythm: Normal rate.  Pulmonary:     Effort: Pulmonary effort is normal.  Musculoskeletal:        General: Normal range of motion.     Cervical back: Normal range of motion.  Neurological:     General: No focal deficit present.     Mental Status: He is alert.  Psychiatric:        Mood and Affect: Mood normal.        Behavior: Behavior normal.        Thought Content: Thought content normal.        Judgment: Judgment normal.    Review of Systems  Constitutional: Negative.   HENT: Negative.    Eyes: Negative.   Respiratory: Negative.    Cardiovascular: Negative.   Gastrointestinal: Negative.   Genitourinary: Negative.   Musculoskeletal: Negative.   Skin: Negative.   Neurological: Negative.   Psychiatric/Behavioral:  Positive for depression and substance abuse. The patient is nervous/anxious.     Blood pressure (!) 132/115, pulse 97, temperature 98.1 F (36.7 C), temperature source Oral, resp. rate 18, SpO2 97%. There is no height or weight on file to calculate BMI.  Past Psychiatric History: polysubstance abuse,  homelessness,  SI,    Is the patient at risk to self? Yes  Has the patient been a risk to self in the past 6 months? No .    Has the patient been a risk to self within the distant past? Yes   Is the patient a risk to others? No   Has the patient been a risk to others in the past 6 months? No   Has the patient been a risk to others within the distant past? No   Past Medical History: see chart  Family History: unknown  Social History: cocaine ,alcohol  Last Labs:  Admission on 08/16/2023, Discharged on 08/16/2023  Component Date Value Ref Range Status   Sodium 08/16/2023 140  135 - 145 mmol/L Final   Potassium 08/16/2023 3.9  3.5 - 5.1 mmol/L Final   Chloride 08/16/2023 106  98 - 111 mmol/L Final   CO2 08/16/2023 25  22 - 32 mmol/L Final   Glucose, Bld 08/16/2023 123 (H)  70 - 99 mg/dL  Final   Glucose reference range applies only to samples taken after fasting for at least 8 hours.   BUN 08/16/2023 14  6 - 20 mg/dL Final   Creatinine, Ser 08/16/2023 0.93  0.61 - 1.24 mg/dL Final   Calcium 21/30/8657 9.1  8.9 - 10.3 mg/dL Final   Total Protein 84/69/6295 7.1  6.5 - 8.1 g/dL Final   Albumin 28/41/3244 3.5  3.5 - 5.0 g/dL Final   AST 06/19/7251 22  15 - 41 U/L Final   ALT 08/16/2023 16  0 - 44 U/L Final   Alkaline Phosphatase 08/16/2023 53  38 - 126 U/L Final   Total Bilirubin 08/16/2023 0.4  0.0 - 1.2 mg/dL Final   GFR, Estimated 08/16/2023 >60  >60 mL/min Final   Comment: (NOTE) Calculated using the CKD-EPI Creatinine Equation (2021)  Anion gap 08/16/2023 9  5 - 15 Final   Performed at [facility], [address] [place], [place] [address]   Alcohol, Ethyl (B) 08/16/2023 <10  <10 mg/dL Final   Comment: (NOTE) Lowest detectable limit for serum alcohol is 10 mg/dL.  For medical purposes only. Performed at [facility], [address] [place], [place] [address]    Salicylate Lvl 08/16/2023 <7.0 (L)  7.0 - 30.0 mg/dL Final   Performed at [facility], [address] [place], [place] [address]   Acetaminophen (Tylenol), Serum 08/16/2023 <10 (L)  10 - 30 ug/mL Final   Comment: (NOTE) Therapeutic concentrations vary significantly. A range of 10-30 ug/mL  may be an effective concentration for many patients. However, some  are best treated at concentrations outside of this range. Acetaminophen concentrations >150 ug/mL at 4 hours after ingestion  and >50 ug/mL at 12 hours after ingestion are often associated with  toxic reactions.  Performed at [facility], [address] [place], [place] [address]    WBC 08/16/2023 10.6 (H)  4.0 - 10.5 K/uL Final   RBC 08/16/2023 4.22  4.22 - 5.81 MIL/uL Final   Hemoglobin 08/16/2023 13.4  13.0 - 17.0 g/dL Final   HCT 47/42/5956 39.4  39.0 - 52.0 % Final   MCV 08/16/2023 93.4  80.0 - 100.0 fL Final    MCH 08/16/2023 31.8  26.0 - 34.0 pg Final   MCHC 08/16/2023 34.0  30.0 - 36.0 g/dL Final   RDW 38/75/6433 13.6  11.5 - 15.5 % Final   Platelets 08/16/2023 441 (H)  150 - 400 K/uL Final   nRBC 08/16/2023 0.0  0.0 - 0.2 % Final   Performed at [facility] Lab, [address], [place], [place] [address]   Opiates 08/16/2023 NONE DETECTED  NONE DETECTED Final   Cocaine 08/16/2023 POSITIVE (A)  NONE DETECTED Final   Benzodiazepines 08/16/2023 NONE DETECTED  NONE DETECTED Final   Amphetamines 08/16/2023 NONE DETECTED  NONE DETECTED Final   Tetrahydrocannabinol 08/16/2023 POSITIVE (A)  NONE DETECTED Final   Barbiturates 08/16/2023 NONE DETECTED  NONE DETECTED Final   Comment: (NOTE) DRUG SCREEN FOR MEDICAL PURPOSES ONLY.  IF CONFIRMATION IS NEEDED FOR ANY PURPOSE, NOTIFY LAB WITHIN 5 DAYS.  LOWEST DETECTABLE LIMITS FOR URINE DRUG SCREEN Drug Class                     Cutoff (ng/mL) Amphetamine and metabolites    1000 Barbiturate and metabolites    200 Benzodiazepine                 200 Opiates and metabolites        300 Cocaine and metabolites        300 THC                            50 Performed at [facility] Lab, [address] [place], [place] [address]   Admission on 08/12/2023, Discharged on 08/13/2023  Component Date Value Ref Range Status   Sodium 08/12/2023 138  135 - 145 mmol/L Final   Potassium 08/12/2023 3.9  3.5 - 5.1 mmol/L Final   Chloride 08/12/2023 103  98 - 111 mmol/L Final   CO2 08/12/2023 24  22 - 32 mmol/L Final   Glucose, Bld 08/12/2023 131 (H)  70 - 99 mg/dL Final   Glucose reference range applies  only to samples taken after fasting for at least 8 hours.   BUN 08/12/2023 13  6 - 20 mg/dL Final   Creatinine, Ser 08/12/2023 1.00  0.61 - 1.24 mg/dL Final   Calcium 16/03/9603 8.9  8.9 - 10.3 mg/dL Final   Total Protein 54/02/8118 7.4  6.5 - 8.1 g/dL Final   Albumin 14/78/2956 3.6  3.5 - 5.0 g/dL Final   AST 21/30/8657 21  15 - 41 U/L Final   ALT  08/12/2023 16  0 - 44 U/L Final   Alkaline Phosphatase 08/12/2023 61  38 - 126 U/L Final   Total Bilirubin 08/12/2023 0.4  0.0 - 1.2 mg/dL Final   GFR, Estimated 08/12/2023 >60  >60 mL/min Final   Comment: (NOTE) Calculated using the CKD-EPI Creatinine Equation (2021)    Anion gap 08/12/2023 11  5 - 15 Final   Performed at [facility], [address] [place], [place] [address]   Alcohol, Ethyl (B) 08/12/2023 <10  <10 mg/dL Final   Comment: (NOTE) Lowest detectable limit for serum alcohol is 10 mg/dL.  For medical purposes only. Performed at [facility], [address], [place], [place] [address]    Salicylate Lvl 08/12/2023 <7.0 (L)  7.0 - 30.0 mg/dL Final   Performed at [facility], [address] [place], [place] [address]   Acetaminophen (Tylenol), Serum 08/12/2023 <10 (L)  10 - 30 ug/mL Final   Comment: (NOTE) Therapeutic concentrations vary significantly. A range of 10-30 ug/mL  may be an effective concentration for many patients. However, some  are best treated at concentrations outside of this range. Acetaminophen concentrations >150 ug/mL at 4 hours after ingestion  and >50 ug/mL at 12 hours after ingestion are often associated with  toxic reactions.  Performed at [facility], [address] [place], [place] [address]    WBC 08/12/2023 10.4  4.0 - 10.5 K/uL Final   RBC 08/12/2023 4.44  4.22 - 5.81 MIL/uL Final   Hemoglobin 08/12/2023 14.1  13.0 - 17.0 g/dL Final   HCT 72/53/6644 41.5  39.0 - 52.0 % Final   MCV 08/12/2023 93.5  80.0 - 100.0 fL Final   MCH 08/12/2023 31.8  26.0 - 34.0 pg Final   MCHC 08/12/2023 34.0  30.0 - 36.0 g/dL Final   RDW 03/47/4259 13.7  11.5 - 15.5 % Final   Platelets 08/12/2023 452 (H)  150 - 400 K/uL Final   nRBC 08/12/2023 0.0  0.0 - 0.2 % Final   Performed at [facility], [address] [place], [place] [address]   Opiates 08/13/2023 NONE DETECTED  NONE DETECTED Final   Cocaine 08/13/2023 POSITIVE  (A)  NONE DETECTED Final   Benzodiazepines 08/13/2023 NONE DETECTED  NONE DETECTED Final   Amphetamines 08/13/2023 NONE DETECTED  NONE DETECTED Final   Tetrahydrocannabinol 08/13/2023 POSITIVE (A)  NONE DETECTED Final   Barbiturates 08/13/2023 NONE DETECTED  NONE DETECTED Final   Comment: (NOTE) DRUG SCREEN FOR MEDICAL PURPOSES ONLY.  IF CONFIRMATION IS NEEDED FOR ANY PURPOSE, NOTIFY LAB WITHIN 5 DAYS.  LOWEST DETECTABLE LIMITS FOR URINE DRUG SCREEN Drug Class                     Cutoff (ng/mL) Amphetamine and metabolites    1000 Barbiturate and metabolites    200 Benzodiazepine                 200 Opiates and metabolites  300 Cocaine and metabolites        300 THC                            50 Performed at [facility], [address] [place], [place] [address]   Admission on 07/09/2023, Discharged on 07/09/2023  Component Date Value Ref Range Status   Lactic Acid, Venous 07/09/2023 0.7  0.5 - 1.9 mmol/L Final   Sodium 07/09/2023 134 (L)  135 - 145 mmol/L Final   Potassium 07/09/2023 3.7  3.5 - 5.1 mmol/L Final   Chloride 07/09/2023 102  98 - 111 mmol/L Final   CO2 07/09/2023 20 (L)  22 - 32 mmol/L Final   Glucose, Bld 07/09/2023 109 (H)  70 - 99 mg/dL Final   Glucose reference range applies only to samples taken after fasting for at least 8 hours.   BUN 07/09/2023 14  6 - 20 mg/dL Final   Creatinine, Ser 07/09/2023 0.90  0.61 - 1.24 mg/dL Final   Calcium 60/45/4098 8.8 (L)  8.9 - 10.3 mg/dL Final   Total Protein 11/91/4782 7.0  6.5 - 8.1 g/dL Final   Albumin 95/62/1308 3.6  3.5 - 5.0 g/dL Final   AST 65/78/4696 20  15 - 41 U/L Final   ALT 07/09/2023 21  0 - 44 U/L Final   Alkaline Phosphatase 07/09/2023 52  38 - 126 U/L Final   Total Bilirubin 07/09/2023 0.6  0.0 - 1.2 mg/dL Final   GFR, Estimated 07/09/2023 >60  >60 mL/min Final   Comment: (NOTE) Calculated using the CKD-EPI Creatinine Equation (2021)    Anion gap 07/09/2023 12  5 - 15 Final   Performed  at [facility], [address], [place], [place] [address]   WBC 07/09/2023 11.3 (H)  4.0 - 10.5 K/uL Final   RBC 07/09/2023 4.21 (L)  4.22 - 5.81 MIL/uL Final   Hemoglobin 07/09/2023 13.2  13.0 - 17.0 g/dL Final   HCT 41/32/4401 39.4  39.0 - 52.0 % Final   MCV 07/09/2023 93.6  80.0 - 100.0 fL Final   MCH 07/09/2023 31.4  26.0 - 34.0 pg Final   MCHC 07/09/2023 33.5  30.0 - 36.0 g/dL Final   RDW 02/72/5366 14.0  11.5 - 15.5 % Final   Platelets 07/09/2023 467 (H)  150 - 400 K/uL Final   nRBC 07/09/2023 0.0  0.0 - 0.2 % Final   Neutrophils Relative % 07/09/2023 72  % Final   Neutro Abs 07/09/2023 8.2 (H)  1.7 - 7.7 K/uL Final   Lymphocytes Relative 07/09/2023 20  % Final   Lymphs Abs 07/09/2023 2.3  0.7 - 4.0 K/uL Final   Monocytes Relative 07/09/2023 7  % Final   Monocytes Absolute 07/09/2023 0.8  0.1 - 1.0 K/uL Final   Eosinophils Relative 07/09/2023 0  % Final   Eosinophils Absolute 07/09/2023 0.1  0.0 - 0.5 K/uL Final   Basophils Relative 07/09/2023 1  % Final   Basophils Absolute 07/09/2023 0.1  0.0 - 0.1 K/uL Final   Immature Granulocytes 07/09/2023 0  % Final   Abs Immature Granulocytes 07/09/2023 0.04  0.00 - 0.07 K/uL Final   Performed at [facility], [address], [place], [place] [address]  Admission on 06/28/2023, Discharged on 06/28/2023  Component Date Value Ref Range Status   Sodium 06/28/2023 134 (L)  135 - 145 mmol/L Final   Potassium 06/28/2023 3.7  3.5 - 5.1 mmol/L Final   Chloride 06/28/2023 107  98 - 111 mmol/L Final   CO2 06/28/2023 21 (L)  22 - 32 mmol/L Final   Glucose, Bld 06/28/2023 100 (H)  70 - 99 mg/dL Final   Glucose reference range applies only to samples taken after fasting for at least 8 hours.   BUN 06/28/2023 24 (H)  6 - 20 mg/dL Final   Creatinine, Ser 06/28/2023 0.88  0.61 - 1.24 mg/dL Final   Calcium 09/81/1914 8.4 (L)  8.9 - 10.3 mg/dL Final   Total Protein 78/29/5621 6.6  6.5 - 8.1 g/dL Final   Albumin 30/86/5784 3.2 (L)  3.5 -  5.0 g/dL Final   AST 69/62/9528 18  15 - 41 U/L Final   ALT 06/28/2023 17  0 - 44 U/L Final   Alkaline Phosphatase 06/28/2023 46  38 - 126 U/L Final   Total Bilirubin 06/28/2023 0.5  0.0 - 1.2 mg/dL Final   GFR, Estimated 06/28/2023 >60  >60 mL/min Final   Comment: (NOTE) Calculated using the CKD-EPI Creatinine Equation (2021)    Anion gap 06/28/2023 6  5 - 15 Final   Performed at [facility], [address], [place], [place] [address]   Alcohol, Ethyl (B) 06/28/2023 <10  <10 mg/dL Final   Comment: (NOTE) Lowest detectable limit for serum alcohol is 10 mg/dL.  For medical purposes only. Performed at [facility], [address] [place], [place] [address]    WBC 06/28/2023 10.2  4.0 - 10.5 K/uL Final   RBC 06/28/2023 4.24  4.22 - 5.81 MIL/uL Final   Hemoglobin 06/28/2023 13.3  13.0 - 17.0 g/dL Final   HCT 72/53/6644 39.3  39.0 - 52.0 % Final   MCV 06/28/2023 92.7  80.0 - 100.0 fL Final   MCH 06/28/2023 31.4  26.0 - 34.0 pg Final   MCHC 06/28/2023 33.8  30.0 - 36.0 g/dL Final   RDW 03/47/4259 13.5  11.5 - 15.5 % Final   Platelets 06/28/2023 341  150 - 400 K/uL Final   nRBC 06/28/2023 0.0  0.0 - 0.2 % Final   Performed at [facility], [address], [place], [place] [address]  Admission on 06/25/2023, Discharged on 06/25/2023  Component Date Value Ref Range Status   WBC 06/25/2023 13.8 (H)  4.0 - 10.5 K/uL Final   RBC 06/25/2023 4.75  4.22 - 5.81 MIL/uL Final   Hemoglobin 06/25/2023 14.8  13.0 - 17.0 g/dL Final   HCT 56/43/3295 44.8  39.0 - 52.0 % Final   MCV 06/25/2023 94.3  80.0 - 100.0 fL Final   MCH 06/25/2023 31.2  26.0 - 34.0 pg Final   MCHC 06/25/2023 33.0  30.0 - 36.0 g/dL Final   RDW 18/84/1660 13.7  11.5 - 15.5 % Final   Platelets 06/25/2023 355  150 - 400 K/uL Final   nRBC 06/25/2023 0.0  0.0 - 0.2 % Final   Neutrophils Relative % 06/25/2023 86  % Final   Neutro Abs 06/25/2023 11.8 (H)  1.7 - 7.7 K/uL Final   Lymphocytes Relative 06/25/2023 11   % Final   Lymphs Abs 06/25/2023 1.5  0.7 - 4.0 K/uL Final   Monocytes Relative 06/25/2023 3  % Final   Monocytes Absolute 06/25/2023 0.4  0.1 - 1.0 K/uL Final   Eosinophils Relative 06/25/2023 0  % Final   Eosinophils Absolute 06/25/2023 0.0  0.0 - 0.5 K/uL Final   Basophils Relative 06/25/2023 0  %  Final   Basophils Absolute 06/25/2023 0.1  0.0 - 0.1 K/uL Final   Immature Granulocytes 06/25/2023 0  % Final   Abs Immature Granulocytes 06/25/2023 0.05  0.00 - 0.07 K/uL Final   Performed at [facility], [address], [place], [place] [address]   Sodium 06/25/2023 137  135 - 145 mmol/L Final   Potassium 06/25/2023 4.0  3.5 - 5.1 mmol/L Final   Chloride 06/25/2023 106  98 - 111 mmol/L Final   CO2 06/25/2023 21 (L)  22 - 32 mmol/L Final   Glucose, Bld 06/25/2023 99  70 - 99 mg/dL Final   Glucose reference range applies only to samples taken after fasting for at least 8 hours.   BUN 06/25/2023 16  6 - 20 mg/dL Final   Creatinine, Ser 06/25/2023 0.97  0.61 - 1.24 mg/dL Final   Calcium 78/46/9629 8.7 (L)  8.9 - 10.3 mg/dL Final   Total Protein 52/84/1324 7.0  6.5 - 8.1 g/dL Final   Albumin 40/03/2724 3.4 (L)  3.5 - 5.0 g/dL Final   AST 36/64/4034 24  15 - 41 U/L Final   ALT 06/25/2023 24  0 - 44 U/L Final   Alkaline Phosphatase 06/25/2023 54  38 - 126 U/L Final   Total Bilirubin 06/25/2023 0.9  0.0 - 1.2 mg/dL Final   GFR, Estimated 06/25/2023 >60  >60 mL/min Final   Comment: (NOTE) Calculated using the CKD-EPI Creatinine Equation (2021)    Anion gap 06/25/2023 10  5 - 15 Final   Performed at [facility], [address], [place], [place] [address]   Color, Urine 06/25/2023 YELLOW  YELLOW Final   APPearance 06/25/2023 HAZY (A)  CLEAR Final   Specific Gravity, Urine 06/25/2023 1.027  1.005 - 1.030 Final   pH 06/25/2023 5.0  5.0 - 8.0 Final   Glucose, UA 06/25/2023 NEGATIVE  NEGATIVE mg/dL Final   Hgb urine dipstick 06/25/2023 NEGATIVE  NEGATIVE Final   Bilirubin Urine  06/25/2023 NEGATIVE  NEGATIVE Final   Ketones, ur 06/25/2023 NEGATIVE  NEGATIVE mg/dL Final   Protein, ur 56/38/7564 NEGATIVE  NEGATIVE mg/dL Final   Nitrite 33/29/5188 NEGATIVE  NEGATIVE Final   Leukocytes,Ua 06/25/2023 NEGATIVE  NEGATIVE Final   Performed at [facility], [address], [place], [place] [address]   Lipase 06/25/2023 31  11 - 51 U/L Final   Performed at [facility], [address] [place], [place] [address]  Admission on 03/20/2023, Discharged on 03/21/2023  Component Date Value Ref Range Status   WBC 03/20/2023 12.3 (H)  4.0 - 10.5 K/uL Final   RBC 03/20/2023 4.26  4.22 - 5.81 MIL/uL Final   Hemoglobin 03/20/2023 13.1  13.0 - 17.0 g/dL Final   HCT 06/25/3233 39.3  39.0 - 52.0 % Final   MCV 03/20/2023 92.3  80.0 - 100.0 fL Final   MCH 03/20/2023 30.8  26.0 - 34.0 pg Final   MCHC 03/20/2023 33.3  30.0 - 36.0 g/dL Final   RDW 57/32/2025 14.5  11.5 - 15.5 % Final   Platelets 03/20/2023 370  150 - 400 K/uL Final   nRBC 03/20/2023 0.0  0.0 - 0.2 % Final   Neutrophils Relative % 03/20/2023 59  % Final   Neutro Abs 03/20/2023 7.3  1.7 - 7.7 K/uL Final   Lymphocytes Relative 03/20/2023 32  % Final   Lymphs Abs 03/20/2023 4.0  0.7 - 4.0 K/uL Final   Monocytes Relative 03/20/2023 8  %  Final   Monocytes Absolute 03/20/2023 1.0  0.1 - 1.0 K/uL Final   Eosinophils Relative 03/20/2023 0  % Final   Eosinophils Absolute 03/20/2023 0.0  0.0 - 0.5 K/uL Final   Basophils Relative 03/20/2023 1  % Final   Basophils Absolute 03/20/2023 0.1  0.0 - 0.1 K/uL Final   Immature Granulocytes 03/20/2023 0  % Final   Abs Immature Granulocytes 03/20/2023 0.03  0.00 - 0.07 K/uL Final   Performed at [facility] Lab, [address], [place], [place] [address]   Sodium 03/20/2023 137  135 - 145 mmol/L Final   Potassium 03/20/2023 4.0  3.5 - 5.1 mmol/L Final   Chloride 03/20/2023 104  98 - 111 mmol/L Final   CO2 03/20/2023 24  22 - 32 mmol/L Final   Glucose, Bld 03/20/2023 91  70  - 99 mg/dL Final   Glucose reference range applies only to samples taken after fasting for at least 8 hours.   BUN 03/20/2023 14  6 - 20 mg/dL Final   Creatinine, Ser 03/20/2023 0.95  0.61 - 1.24 mg/dL Final   Calcium 60/45/4098 8.8 (L)  8.9 - 10.3 mg/dL Final   Total Protein 11/91/4782 7.4  6.5 - 8.1 g/dL Final   Albumin 95/62/1308 3.7  3.5 - 5.0 g/dL Final   AST 65/78/4696 33  15 - 41 U/L Final   ALT 03/20/2023 34  0 - 44 U/L Final   Alkaline Phosphatase 03/20/2023 71  38 - 126 U/L Final   Total Bilirubin 03/20/2023 0.8  0.3 - 1.2 mg/dL Final   GFR, Estimated 03/20/2023 >60  >60 mL/min Final   Comment: (NOTE) Calculated using the CKD-EPI Creatinine Equation (2021)    Anion gap 03/20/2023 9  5 - 15 Final   Performed at [facility] Lab, [address] [place], [place] [address]    Allergies: Naprosyn [naproxen] and Nsaids  Medications:     Medical Decision Making  Observation     Recommendations  Based on my evaluation the patient does not appear to have an emergency medical condition.  Sindy Guadeloupe, NP 08/20/23  3:16 AM

## 2023-08-20 NOTE — Discharge Instructions (Addendum)
 Patient is instructed to call 911 to the nearest ED in the event of worsening symptoms or if SIHI develop. Patient verbalized his understanding,

## 2023-09-15 DIAGNOSIS — N3001 Acute cystitis with hematuria: Secondary | ICD-10-CM | POA: Diagnosis not present
